# Patient Record
Sex: Female | Born: 1985 | Race: Black or African American | Hispanic: No | Marital: Single | Smoking: Current every day smoker
Health system: Southern US, Community
[De-identification: ages and names within clinical notes are randomized; demographics above are authoritative.]

## PROBLEM LIST (undated history)

## (undated) DIAGNOSIS — J45909 Unspecified asthma, uncomplicated: Secondary | ICD-10-CM

## (undated) HISTORY — PX: TUBAL LIGATION: SHX77

## (undated) HISTORY — PX: TONSILLECTOMY: SUR1361

---
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
## 2005-10-19 ENCOUNTER — Emergency Department (HOSPITAL_COMMUNITY): Admission: EM | Admit: 2005-10-19 | Discharge: 2005-10-19 | Payer: Self-pay | Admitting: Emergency Medicine

## 2006-04-26 ENCOUNTER — Inpatient Hospital Stay (HOSPITAL_COMMUNITY): Admission: RE | Admit: 2006-04-26 | Discharge: 2006-04-27 | Payer: Self-pay | Admitting: Obstetrics and Gynecology

## 2006-05-01 ENCOUNTER — Ambulatory Visit (HOSPITAL_COMMUNITY): Admission: RE | Admit: 2006-05-01 | Discharge: 2006-05-01 | Payer: Self-pay | Admitting: Obstetrics and Gynecology

## 2006-06-11 ENCOUNTER — Inpatient Hospital Stay (HOSPITAL_COMMUNITY): Admission: RE | Admit: 2006-06-11 | Discharge: 2006-06-13 | Payer: Self-pay | Admitting: Obstetrics and Gynecology

## 2006-06-11 ENCOUNTER — Encounter (INDEPENDENT_AMBULATORY_CARE_PROVIDER_SITE_OTHER): Payer: Self-pay | Admitting: *Deleted

## 2006-06-24 ENCOUNTER — Emergency Department (HOSPITAL_COMMUNITY): Admission: EM | Admit: 2006-06-24 | Discharge: 2006-06-24 | Payer: Self-pay | Admitting: Emergency Medicine

## 2008-05-22 ENCOUNTER — Emergency Department (HOSPITAL_COMMUNITY): Admission: EM | Admit: 2008-05-22 | Discharge: 2008-05-22 | Payer: Self-pay | Admitting: Emergency Medicine

## 2008-05-28 ENCOUNTER — Emergency Department (HOSPITAL_COMMUNITY): Admission: EM | Admit: 2008-05-28 | Discharge: 2008-05-28 | Payer: Self-pay | Admitting: Emergency Medicine

## 2008-06-02 ENCOUNTER — Encounter: Admission: RE | Admit: 2008-06-02 | Discharge: 2008-06-02 | Payer: Self-pay | Admitting: Family Medicine

## 2008-08-23 ENCOUNTER — Emergency Department (HOSPITAL_COMMUNITY): Admission: EM | Admit: 2008-08-23 | Discharge: 2008-08-23 | Payer: Self-pay | Admitting: Emergency Medicine

## 2010-06-26 ENCOUNTER — Emergency Department (HOSPITAL_COMMUNITY): Admission: EM | Admit: 2010-06-26 | Discharge: 2010-06-26 | Payer: Self-pay | Admitting: Emergency Medicine

## 2010-06-30 ENCOUNTER — Emergency Department (HOSPITAL_COMMUNITY): Admission: EM | Admit: 2010-06-30 | Discharge: 2010-06-30 | Payer: Self-pay | Admitting: Emergency Medicine

## 2010-10-12 ENCOUNTER — Ambulatory Visit (HOSPITAL_COMMUNITY)
Admission: RE | Admit: 2010-10-12 | Discharge: 2010-10-12 | Payer: Self-pay | Source: Home / Self Care | Attending: Obstetrics & Gynecology | Admitting: Obstetrics & Gynecology

## 2011-01-16 LAB — URINALYSIS, ROUTINE W REFLEX MICROSCOPIC
Glucose, UA: NEGATIVE mg/dL
Ketones, ur: NEGATIVE mg/dL
Nitrite: NEGATIVE
pH: 5.5 (ref 5.0–8.0)

## 2011-01-16 LAB — CBC
Hemoglobin: 14.1 g/dL (ref 12.0–15.0)
MCH: 30.5 pg (ref 26.0–34.0)
MCHC: 35 g/dL (ref 30.0–36.0)
Platelets: 268 10*3/uL (ref 150–400)
RDW: 13.4 % (ref 11.5–15.5)

## 2011-01-16 LAB — COMPREHENSIVE METABOLIC PANEL
ALT: 16 U/L (ref 0–35)
AST: 20 U/L (ref 0–37)
Albumin: 4.3 g/dL (ref 3.5–5.2)
Calcium: 9.7 mg/dL (ref 8.4–10.5)
Creatinine, Ser: 0.83 mg/dL (ref 0.4–1.2)
GFR calc Af Amer: >60 mL/min (ref >60)
Sodium: 141 mEq/L (ref 135–145)
Total Protein: 7.1 g/dL (ref 6.0–8.3)

## 2011-01-16 LAB — RAPID URINE DRUG SCREEN, HOSP PERFORMED
Barbiturates: NOT DETECTED
Benzodiazepines: POSITIVE — AB
Cocaine: NOT DETECTED

## 2011-01-16 LAB — SURGICAL PCR SCREEN: MRSA, PCR: NEGATIVE

## 2011-01-19 LAB — PREGNANCY, URINE: Preg Test, Ur: NEGATIVE

## 2011-01-19 LAB — URINALYSIS, ROUTINE W REFLEX MICROSCOPIC
Glucose, UA: NEGATIVE mg/dL
Ketones, ur: 15 mg/dL — AB
Nitrite: NEGATIVE
Specific Gravity, Urine: 1.03 (ref 1.005–1.030)
pH: 5.5 (ref 5.0–8.0)

## 2011-01-19 LAB — CBC
HCT: 43.8 % (ref 36.0–46.0)
MCHC: 33.5 g/dL (ref 30.0–36.0)
MCV: 91.5 fL (ref 78.0–100.0)
RDW: 13.6 % (ref 11.5–15.5)

## 2011-01-19 LAB — DIFFERENTIAL
Lymphs Abs: 0.4 10*3/uL — ABNORMAL LOW (ref 0.7–4.0)
Monocytes Relative: 5 % (ref 3–12)
Neutro Abs: 3.1 10*3/uL (ref 1.7–7.7)
Neutrophils Relative %: 84 % — ABNORMAL HIGH (ref 43–77)

## 2011-01-19 LAB — COMPREHENSIVE METABOLIC PANEL
BUN: 9 mg/dL (ref 6–23)
Calcium: 9 mg/dL (ref 8.4–10.5)
Glucose, Bld: 99 mg/dL (ref 70–99)
Sodium: 135 mEq/L (ref 135–145)
Total Protein: 7 g/dL (ref 6.0–8.3)

## 2011-02-10 ENCOUNTER — Emergency Department (HOSPITAL_COMMUNITY)
Admission: EM | Admit: 2011-02-10 | Discharge: 2011-02-10 | Disposition: A | Discharge status: Home / Self Care | Payer: Self-pay | Attending: Emergency Medicine | Admitting: Emergency Medicine

## 2011-02-10 DIAGNOSIS — R1903 Right lower quadrant abdominal swelling, mass and lump: Secondary | ICD-10-CM | POA: Insufficient documentation

## 2011-02-10 DIAGNOSIS — R109 Unspecified abdominal pain: Secondary | ICD-10-CM | POA: Insufficient documentation

## 2011-03-24 NOTE — Discharge Summary (Signed)
Jill Kramer, Jill Kramer             ACCOUNT NO.:  [id number]   MEDICAL RECORD NO.:  [id number]          PATIENT TYPE:  INP   LOCATION:  A412                          FACILITY:  [facility]   PHYSICIAN:  Richardean Canal, M.D.  DATE OF BIRTH:  Jun 06, 1986   DATE OF ADMISSION:  06/11/2006  DATE OF DISCHARGE:  LH                                 DISCHARGE SUMMARY   HISTORY:  This 25yo single black female gravida 2, now para 2, cesarean  section 1 (now 2) with estimated date of confinement June 26, 2006 was  admitted at [redacted] weeks gestation with a history of prior cesarean section.  The patient was admitted in labor and it was decided to proceed with a  cesarean section.  The details of the patient's history and physical  examination are documented in the typed admission note.   OPERATIVE PROCEDURES:  June 11, 2006 repeat low transverse cesarean  section.  The patient was delivered of a 5 pound 12 ounce female infant,  Apgar 9/9.  The estimated blood loss for this procedure was less than 500  mL.   HOSPITAL COURSE:  The patient's postoperative course was afebrile and  uneventful.  The patient had normal resumption of bowel and bladder function  and showed evidence of good wound healing.  The patient was discharged to  home on the morning of her second postoperative day in satisfactory  condition.   FINAL DIAGNOSIS:  Intrauterine gestation at 38 weeks, history of prior  cesarean section.   DISPOSITION:  Discharged home.  Office follow-up for skin staple removal in  one week.  Motrin 600 #15 take one four times daily for pain prevention.  Vicodin 5/500 #15 take one every 4 to 6 hours as needed for pain  breakthrough.                                            ______________________________  Richardean Canal, M.D.     RW/MEDQ  D:  06/13/2006  T:  06/13/2006  Job:  [id number]

## 2011-03-24 NOTE — Group Therapy Note (Signed)
NAMEEMMALYNNE, COURTNEY             ACCOUNT NO.:  [id number]   MEDICAL RECORD NO.:  [id number]          PATIENT TYPE:  OIB   LOCATION:  LDR3                          FACILITY:  [facility]   PHYSICIAN:  Tilda Burrow, M.D. DATE OF BIRTH:  [birth year]   DATE OF PROCEDURE:  DATE OF DISCHARGE:                                   PROGRESS NOTE   PROGRESS NOTE:  Jill Kramer is now [redacted] weeks pregnant. She was last week in  labor and delivery for complaints lower abdominal cramping and contractions.  She was having a few contractions when she came in last time and responded  to subcu terbutaline and subsequent Po dose.  She received a course of  betamethasone.  We held her for over 24 hours observation because the baby  was so low, however there was no cervical change. This morning she is not  having any contractions; when she complains of a cramp, hjer abdomen  remained soft.  Her cervix is unchanged, very posterior and the vertex is  low at 0 to -1 station.  So right now we are evaluating urinalysis to see if  we can find a source of her lower abdominal pain, although I do wonder if it  is still not the pressure due to the low position of the baby.  We will  continue to monitor her contractions. The fetal heart rate looks within  normal limits.      Jacklyn Shell, C.N.M.      Tilda Burrow, M.D.  Electronically Signed    FC/MEDQ  D:  05/01/2006  T:  05/01/2006  Job:  [id number]   cc:   Tilda Burrow, M.D.  Fax: [phone]   [facility]

## 2011-03-24 NOTE — Group Therapy Note (Signed)
Jill Kramer, Jill Kramer             ACCOUNT NO.:  [id number]   MEDICAL RECORD NO.:  [id number]          PATIENT TYPE:  INP   LOCATION:  LDR2                          FACILITY:  [facility]   PHYSICIAN:  Tilda Burrow, M.D. DATE OF BIRTH:  Jan 12, 1986   DATE OF PROCEDURE:  04/27/2006  DATE OF DISCHARGE:                                   PROGRESS NOTE   HISTORY:  Jill Kramer was admitted in the early morning hours of April 26, 2006  by [facility], with pre-term labor.  She received four subcutaneous doses of  Terbutaline for contractions and was started on a p.o. protocol.  She got  relief from her contractions, but she did have what she considered an 8/10  amount of pain in the form of pressure.   PHYSICAL EXAMINATION:  The vertex is very low at 0 station; however,  her  cervix has remained closed and posterior.  We kept her on monitoring  throughout the day and the night, just to make sure that there was no break-  through uterine activity. She received two doses of betamethasone 24 hours  apart.  She is not having any complaints of pressure now.  No contractions  and the fetal heart rate is reactive.  Additionally her OB ultrasound was  normal.   We will send her home on p.o. Terbutaline, to follow up in the office next  week.     ______________________________  Jacklyn Shell, CNM      Tilda Burrow, M.D.  Electronically Signed    FC/MEDQ  D:  04/27/2006  T:  04/27/2006  Job:  [id number]

## 2011-03-24 NOTE — H&P (Signed)
Jill Kramer, Jill Kramer             ACCOUNT NO.:  [id number]   MEDICAL RECORD NO.:  [id number]          PATIENT TYPE:  OIB   LOCATION:  LDR1                          FACILITY:  [facility]   PHYSICIAN:  Lazaro Arms, M.D.   DATE OF BIRTH:  [birth year]   DATE OF ADMISSION:  12/19/2004  DATE OF DISCHARGE:  LH                                HISTORY & PHYSICAL   HISTORY OF PRESENT ILLNESS:  Jill Kramer is an 25 year old, gravida 1, para 0,  with a twin pregnancy at 26 weeks and 3 days, who started having cramping at  7 p.m. tonight, who presents at 9:30 complaining of cramping.  Upon being  placed on the monitor, it was noted she was having contractions.  I was  notified at approximately 10 o'clock.  Also, the patient states at  approximately 10 p.m., she started feeling wet.  At that time, the nurse  noticed a puddle, and nitrazine was positive at that time.   MEDICAL HISTORY:  Negative.   SURGICAL HISTORY:  Positive for tonsils and adenoids.   ALLERGIES:  No known allergies.   FAMILY HISTORY:  Negative.   SOCIAL HISTORY:  Negative.   PRENATAL COURSE:  Positive for a twin pregnancy with separate sacs.  Blood  type is B positive.  She was positive for THC and UDS.  Hepatitis B surface  antigen negative.  HIV is negative.  Serology is non-reactive.  Pap is class  I.  GC and Chlamydia were negative.  Glucola within normal limits.  Sickle  cell screen was negative.   PHYSICAL EXAMINATION:  VITAL SIGNS:  Temperature 98.2, pulse 91,  respirations 20, blood pressure 123/65.  Fetal heart rate showed some mild  variables.  PELVIC:  A sterile speculum exam was performed.  The patient is leaking a  copious amount of clear fluid with bits of vernix in the clear fluid.  Could  not visualize the cervix.  A vaginal exam was not performed at this time due  to prematurity.   Dr. Despina Hidden was notified.  The patient was given 0.25 of Brethine subcutaneous.  Dr. Despina Hidden is on his way in to arrange for the  patient's transfer to a  tertiary facility for preterm newborn care.     DL/MEDQ  D:  56/21/3086  T:  12/19/2004  Job:  [id number]

## 2011-03-24 NOTE — Op Note (Signed)
Jill Kramer, Jill Kramer             ACCOUNT NO.:  [id number]   MEDICAL RECORD NO.:  [id number]          PATIENT TYPE:  INP   LOCATION:  A412                          FACILITY:  [facility]   PHYSICIAN:  Richardean Canal, M.D.  DATE OF BIRTH:  [birth year]   DATE OF PROCEDURE:  06/11/2006  DATE OF DISCHARGE:                                 OPERATIVE REPORT   PREOPERATIVE DIAGNOSIS:  Intrauterine gestation, 38 weeks.  History of prior  cesarean section.   POSTOPERATIVE DIAGNOSIS:  Intrauterine gestation, 38 weeks.  History of  prior cesarean section.   PROCEDURE:  Repeat low transverse cesarean section.   SURGEON:  Richardean Canal, M.D.   ANESTHESIA:  Spinal.   DESCRIPTION OF OPERATIVE PROCEDURE:  Following induction of spinal  anesthesia, the patient was placed in supine position with a roll under the  right hip.  The abdomen was suitably prepped and draped for surgery.  The  abdomen was opened through a Pfannenstiel incision in the line of the old  scar.  The uterovesical fold of peritoneum was incised transversely and the  bladder flap developed with sharp dissection.  A transverse incision was  made in the lower uterine segment and extended with two index fingers.  The  fetus was presenting vertex and in a transverse position and deeply engaged.  The head was disengaged from the pelvis and slowly delivered through the  incision.  The airway was suctioned prior to the first breath.  The patient  was delivered of a normal-appearing 5-pound 12-ounce female infant, Apgar  9/9.  The cord was clamped and divided and the newborn transferred to Birder Robson, D.O. attending the newborn.  Pitocin 20 units was added to the  intravenous infusion and run rapidly.  Ancef 1 gram was administered  intravenously.  The uterus was firmed up with manual massage and the  placenta delivered and inspected.  The placenta appeared intact and normal  and was discarded.  The uterus was exteriorized.  The uterine  incision was  closed with a running interlocking 0 chromic suture.  A second layer of 0  chromic suture was placed in a running, non-interlocking vertical mattress  imbricating stitch.  The line of incision appeared dry.  The tubes and  ovaries appeared normal.  The uterus was replaced.  The gutters were  cleansed.  The peritoneum was closed with a running 2-0 chromic suture.  The  rectus muscles were pulled together in the midline with two interrupted 0  Vicryl sutures.  The fascia was closed with two segmentally run 0 Vicryl  sutures tied together in the midline.  The subcutaneous layer had an ooze  with no identifiable bleeders.  The subcutaneous layer was irrigated with  normal saline and continued to exhibit an ooze.  For this reason, this layer  was closed with a running 2-0 Vicryl suture.  The  skin was closed with skin staples.  The estimated blood loss was less than  500 mL.  A pressure dressing was placed.  All sponge, instrument, and needle  counts were reported correct.  The patient tolerated the procedure well  and  left the operating room in satisfactory postoperative condition.           ______________________________  Richardean Canal, M.D.     RW/MEDQ  D:  06/11/2006  T:  06/11/2006  Job:  [id number]

## 2011-03-24 NOTE — H&P (Signed)
Jill Kramer, Jill Kramer             ACCOUNT NO.:  [id number]   MEDICAL RECORD NO.:  [id number]          PATIENT TYPE:  OIB   LOCATION:  LDR2                          FACILITY:  [facility]   PHYSICIAN:  Tilda Burrow, M.D. DATE OF BIRTH:  [birth year]   DATE OF ADMISSION:  04/26/2006  DATE OF DISCHARGE:  LH                                HISTORY & PHYSICAL   HISTORY OF PRESENT ILLNESS:  This 25 year old female, gravida 2, para 0-1-0-  1, with a prior preterm delivery at 26 weeks by cesarean section for twins,  is admitted at this time for management of preterm uterine irritability and  pelvic pressure.   Jill Kramer presented shortly after 1 a.m. this morning due to discomfort which  had lasted several hours, was managed on subcu terbutaline protocol with 2  doses of subcu terbutaline, betamethasone therapy and observation.  She has  continued to complain of pelvic discomfort and at 2 p.m. today is noted to  have recurrence of uterine contractions of a mild intensity on a regular  pattern.  She is therefore admitted for continued overnight observation and  resumption of subcu terbutaline therapy.  She was only 4 hours after her  last oral dose of terbutaline when she began to contract more significantly.   PAST MEDICAL HISTORY:  Benign.   SURGICAL HISTORY:  Cesarean section in February 2006, [facility], for  premature twins.   ALLERGIES:  None.   SOCIAL HISTORY:  Lives with the baby's father.  Not working.   FAMILY HISTORY:  Positive for hypertension, diabetes.   She used to smoke.  She denies alcohol or recreational drugs.   PHYSICAL EXAMINATION:  GENERAL:  A healthy-appearing, somewhat anxious  African-American female who has multiple complaints.  She is very aware of  any discomforts or changes in fetal movement or position, which makes  assessing the severity of contractions somewhat difficult.  She is alert and  oriented x3.  ABDOMEN:  A preterm gravid uterus, with no  tenderness except mild discomfort  with contractions.  Ultrasound has been performed that shows estimated fetal  weight of 3 pounds 13 ounces, vertex presentation is confirmed.  Cervix is  posterior long and closed with presenting part low in the pelvis.   PLAN:  Admit, preterm management with the addition of IV antibiotics,  continuation of terbutaline.  Patient started on subcu terbutaline at this  point in time.  Good prognosis for delay of labor, delay of delivery.  If  labor symptoms become more significant, may require more magnesium sulfate  therapy.      Tilda Burrow, M.D.  Electronically Signed     JVF/MEDQ  D:  04/26/2006  T:  04/26/2006  Job:  [id number]

## 2011-03-24 NOTE — H&P (Signed)
Jill Kramer, Jill Kramer             ACCOUNT NO.:  [id number]   MEDICAL RECORD NO.:  [id number]          PATIENT TYPE:  INP   LOCATION:  A412                          FACILITY:  [facility]   PHYSICIAN:  Richardean Canal, M.D.  DATE OF BIRTH:  02-26-1986   DATE OF ADMISSION:  06/11/2006  DATE OF DISCHARGE:  LH                                HISTORY & PHYSICAL   HISTORY:  This is a 25 year old single black female, gravida 2, para 1, C-  section 1, with [facility] June 26, 2006, admitted at 18 weeks' gestation with a  history of prior cesarean section.  The patient has had previous observation  admissions to [facility] and has been managed on terbutaline.  The patient  broke through on her oral terbutaline and came to Labor and Delivery, where  she was noted to be having painful contractions every 3-5 minutes.  Membranes are intact.  Nifedipine 20 mg was prescribed orally, but the  patient vomited.  It was decided to proceed with repeat cesarean section at  38 weeks' gestation.   PAST MEDICAL HISTORY:   MEDICAL:  Negative.   SURGICAL:  Cesarean section in 2006 at 26 weeks for twins; 1 twin survived  and 1 died at 10 days.   DRUG ALLERGIES:  None known.   REGULAR MEDICATIONS:  Prenatal vitamins and iron.   PHYSICAL EXAMINATION:  GENERAL:  The patient appears as a well-developed,  well-nourished, term-pregnant black female in no acute distress, having 3-  to 5-minute painful contractions.  HEART:  Normal sinus rhythm.  No murmurs are heard.  LUNGS:  Clear.  ABDOMEN:  The fetus is presenting vertex.  The fundus is term size.  There  is a Pfannenstiel scar on the abdomen.  The uterus is soft and nontender  between contractions.  EXTREMITIES:  There is no peripheral edema.  Reflexes are normal.   IMPRESSION:  1.  Uterine gestation at 38 weeks.  2.  History of prior cesarean section.  3.  Early labor.   DISPOSITION:  The patient will undergo repeat cesarean section and has been  carefully  counseled with her mother for this procedure.  Possible  complications of surgery including, but not limited to bleeding,  transfusion, infection, injury to an internal organ and wound complications  have been discussed with the patient and her mother.  The patient has  verbalized understanding and consent.                                            ______________________________  Richardean Canal, M.D.     RW/MEDQ  D:  06/11/2006  T:  06/11/2006  Job:  [id number]

## 2011-07-13 ENCOUNTER — Emergency Department (HOSPITAL_COMMUNITY)
Admission: EM | Admit: 2011-07-13 | Discharge: 2011-07-13 | Disposition: A | Discharge status: Home / Self Care | Payer: Self-pay | Attending: Emergency Medicine | Admitting: Emergency Medicine

## 2011-07-13 DIAGNOSIS — F17200 Nicotine dependence, unspecified, uncomplicated: Secondary | ICD-10-CM | POA: Insufficient documentation

## 2011-07-13 DIAGNOSIS — K7689 Other specified diseases of liver: Secondary | ICD-10-CM | POA: Insufficient documentation

## 2011-07-13 DIAGNOSIS — R109 Unspecified abdominal pain: Secondary | ICD-10-CM | POA: Insufficient documentation

## 2011-07-13 DIAGNOSIS — Z9851 Tubal ligation status: Secondary | ICD-10-CM | POA: Insufficient documentation

## 2011-07-13 LAB — DIFFERENTIAL
Basophils Relative: 0 % (ref 0–1)
Monocytes Relative: 10 % (ref 3–12)
Neutro Abs: 2.1 10*3/uL (ref 1.7–7.7)
Neutrophils Relative %: 47 % (ref 43–77)

## 2011-07-13 LAB — CBC
Hemoglobin: 13.5 g/dL (ref 12.0–15.0)
MCHC: 33.9 g/dL (ref 30.0–36.0)
Platelets: 272 10*3/uL (ref 150–400)
RBC: 4.43 MIL/uL (ref 3.87–5.11)

## 2011-07-13 LAB — BASIC METABOLIC PANEL
BUN: 15 mg/dL (ref 6–23)
Chloride: 102 mEq/L (ref 96–112)
GFR calc Af Amer: >60 mL/min (ref >60)
Potassium: 3.9 mEq/L (ref 3.5–5.1)
Sodium: 139 mEq/L (ref 135–145)

## 2011-07-13 LAB — URINE MICROSCOPIC-ADD ON

## 2011-07-13 LAB — URINALYSIS, ROUTINE W REFLEX MICROSCOPIC
Ketones, ur: NEGATIVE mg/dL
Leukocytes, UA: NEGATIVE
Nitrite: NEGATIVE
Specific Gravity, Urine: 1.01 (ref 1.005–1.030)
pH: 7.5 (ref 5.0–8.0)

## 2011-07-13 LAB — PREGNANCY, URINE: Preg Test, Ur: NEGATIVE

## 2011-07-13 MED ORDER — OXYCODONE-ACETAMINOPHEN 5-325 MG PO TABS: 1.0000 | ORAL_TABLET | ORAL | Status: AC | PRN

## 2011-07-13 MED ORDER — FENTANYL CITRATE 0.05 MG/ML IJ SOLN
50.0000 ug | INTRAMUSCULAR | Status: AC
  Administered 2011-07-13: 50 ug via INTRAVENOUS
  Filled 2011-07-13: qty 2

## 2011-07-13 NOTE — ED Notes (Signed)
Pt states she has had approx. 2 year period of abd pain which begins acutely and then subsides over time. Pain onset this am has not resolved.

## 2011-07-13 NOTE — ED Provider Notes (Addendum)
History     CSN: [id number] Arrival date & time: 07/13/2011 10:01 AM  Chief Complaint  Patient presents with  . Abdominal Pain   Patient is a 25 y.o. female presenting with abdominal pain. The history is provided by the patient.  Abdominal Pain The primary symptoms of the illness include abdominal pain. The primary symptoms of the illness do not include nausea, vomiting or diarrhea. The onset of the illness was sudden. The problem has been resolved.  The patient states that she believes she is currently not pregnant. The patient has not had a change in bowel habit. Symptoms associated with the illness do not include diaphoresis or constipation.  Episode began suddenly this morning, and lasted about 1.5 hours. She has had similar episodes in the past two years, but they usually only last a few seconds. Pain is severe - rated 10/10, but is at 0/10 currently. Pain is described as crampy. Nothing makes it better, nothing makes it worse. She has been seen in the ED for this and was told there was something on her liver, but her PCP told her it was nothing.  History reviewed. No pertinent past medical history.  Past Surgical History  Procedure Date  . Tubal ligation     No family history on file.  History  Substance Use Topics  . Smoking status: Current Everyday Smoker  . Smokeless tobacco: Not on file  . Alcohol Use: No    OB History    Grav Para Term Preterm Abortions TAB SAB Ect Mult Living                  Review of Systems  Constitutional: Negative for diaphoresis.  Gastrointestinal: Positive for abdominal pain. Negative for nausea, vomiting, diarrhea and constipation.  All other systems reviewed and are negative.    Physical Exam  BP 115/56  Pulse 70  Temp(Src) 98.3 F (36.8 C) (Oral)  Resp 22  Wt 154 lb (69.854 kg)  SpO2 100%  LMP 07/09/2011  Physical Exam  Constitutional: She is oriented to person, place, and time. She appears well-developed and well-nourished.  No distress.  HENT:  Head: Normocephalic and atraumatic.  Right Ear: External ear normal.  Left Ear: External ear normal.  Mouth/Throat: Oropharynx is clear and moist.  Eyes: Conjunctivae and EOM are normal. Pupils are equal, round, and reactive to light. No scleral icterus.  Neck: Normal range of motion. Neck supple. No JVD present.  Cardiovascular: Normal rate, regular rhythm and normal heart sounds.   No murmur heard. Pulmonary/Chest: Effort normal and breath sounds normal. She has no wheezes. She has no rales.  Abdominal: Soft. Bowel sounds are normal. She exhibits no mass. There is tenderness. There is no rebound and no guarding.       Mild lower abdominal and RUQ tenderness. No CVA tenderness.  Musculoskeletal: Normal range of motion. She exhibits no edema and no tenderness.  Lymphadenopathy:    She has no cervical adenopathy.  Neurological: She is alert and oriented to person, place, and time. She has normal reflexes. No cranial nerve deficit.  Skin: Skin is warm and dry. No rash noted.  Psychiatric: She has a normal mood and affect. Her behavior is normal. Thought content normal.    ED Course  Procedures Results for orders placed during the hospital encounter of 07/13/11  PREGNANCY, URINE      Component Value Range   Preg Test, Ur NEGATIVE    URINALYSIS, ROUTINE W REFLEX MICROSCOPIC  Component Value Range   Color, Urine YELLOW  YELLOW    Appearance CLEAR  CLEAR    Specific Gravity, Urine 1.010  1.005 - 1.030    pH 7.5  5.0 - 8.0    Glucose, UA NEGATIVE  NEGATIVE (mg/dL)   Hgb urine dipstick NEGATIVE  NEGATIVE    Bilirubin Urine NEGATIVE  NEGATIVE    Ketones, ur NEGATIVE  NEGATIVE (mg/dL)   Protein, ur TRACE (*) NEGATIVE (mg/dL)   Urobilinogen, UA 1.0  0.0 - 1.0 (mg/dL)   Nitrite NEGATIVE  NEGATIVE    Leukocytes, UA NEGATIVE  NEGATIVE   CBC      Component Value Range   WBC 4.4  4.0 - 10.5 (K/uL)   RBC 4.43  3.87 - 5.11 (MIL/uL)   Hemoglobin 13.5  12.0 - 15.0  (g/dL)   HCT 16.1  09.6 - 04.5 (%)   MCV 89.8  78.0 - 100.0 (fL)   MCH 30.5  26.0 - 34.0 (pg)   MCHC 33.9  30.0 - 36.0 (g/dL)   RDW 40.9  81.1 - 91.4 (%)   Platelets 272  150 - 400 (K/uL)  DIFFERENTIAL      Component Value Range   Neutrophils Relative 47  43 - 77 (%)   Neutro Abs 2.1  1.7 - 7.7 (K/uL)   Lymphocytes Relative 40  12 - 46 (%)   Lymphs Abs 1.8  0.7 - 4.0 (K/uL)   Monocytes Relative 10  3 - 12 (%)   Monocytes Absolute 0.4  0.1 - 1.0 (K/uL)   Eosinophils Relative 3  0 - 5 (%)   Eosinophils Absolute 0.1  0.0 - 0.7 (K/uL)   Basophils Relative 0  0 - 1 (%)   Basophils Absolute 0.0  0.0 - 0.1 (K/uL)  BASIC METABOLIC PANEL      Component Value Range   Sodium 139  135 - 145 (mEq/L)   Potassium 3.9  3.5 - 5.1 (mEq/L)   Chloride 102  96 - 112 (mEq/L)   CO2 25  19 - 32 (mEq/L)   Glucose, Bld 87  70 - 99 (mg/dL)   BUN 15  6 - 23 (mg/dL)   Creatinine, Ser 7.82  0.50 - 1.10 (mg/dL)   Calcium 9.3  8.4 - 95.6 (mg/dL)   GFR calc non Af Amer >60  >60 (mL/min)   GFR calc Af Amer >60  >60 (mL/min)  URINE MICROSCOPIC-ADD ON      Component Value Range   RBC / HPF 0-2  <3 (RBC/hpf)   No results found.   MDM Prior ED visits, labs, x-rays reviewed. CT scan one year ago showed a small hepatic lesion which was felt to be benign, but radiologist had recommended follow-up MRI to further evaluate.  Episodic pain is most likely functional in origin given normal labs and unremarkable CT and benign course over two years.  Discussed labs and prior evaluation with patient and her family. She had MRI to follow up on liver lesion. No indication for imaging today, will refer to GI for outpatient consult. Suspect IBS.  Dione Booze, MD 07/13/11 1324  Dione Booze, MD 07/13/11 [id number]

## 2011-07-13 NOTE — ED Notes (Signed)
Pt complain of sudden onset of generalized abd pain

## 2011-08-04 LAB — DIFFERENTIAL
Lymphocytes Relative: 37
Lymphs Abs: 1.7
Monocytes Absolute: 0.3
Monocytes Relative: 6
Neutro Abs: 2.4
Neutrophils Relative %: 54

## 2011-08-04 LAB — CBC
Hemoglobin: 13.1
MCHC: 33.3
RBC: 4.41
WBC: 4.4

## 2011-08-10 ENCOUNTER — Emergency Department (HOSPITAL_COMMUNITY)
Admission: EM | Admit: 2011-08-10 | Discharge: 2011-08-10 | Discharge status: Against Medical Advice | Payer: Self-pay | Attending: Emergency Medicine | Admitting: Emergency Medicine

## 2011-08-10 DIAGNOSIS — T63461A Toxic effect of venom of wasps, accidental (unintentional), initial encounter: Secondary | ICD-10-CM | POA: Insufficient documentation

## 2011-08-10 DIAGNOSIS — T6391XA Toxic effect of contact with unspecified venomous animal, accidental (unintentional), initial encounter: Secondary | ICD-10-CM | POA: Insufficient documentation

## 2011-08-10 DIAGNOSIS — Z5320 Procedure and treatment not carried out because of patient's decision for unspecified reasons: Secondary | ICD-10-CM | POA: Insufficient documentation

## 2011-08-12 DIAGNOSIS — IMO0001 Reserved for inherently not codable concepts without codable children: Secondary | ICD-10-CM | POA: Insufficient documentation

## 2011-08-12 DIAGNOSIS — L03019 Cellulitis of unspecified finger: Secondary | ICD-10-CM | POA: Insufficient documentation

## 2011-08-12 DIAGNOSIS — L02519 Cutaneous abscess of unspecified hand: Secondary | ICD-10-CM | POA: Insufficient documentation

## 2011-08-12 DIAGNOSIS — F17200 Nicotine dependence, unspecified, uncomplicated: Secondary | ICD-10-CM | POA: Insufficient documentation

## 2011-08-12 DIAGNOSIS — M79609 Pain in unspecified limb: Secondary | ICD-10-CM | POA: Insufficient documentation

## 2011-08-12 DIAGNOSIS — M2540 Effusion, unspecified joint: Secondary | ICD-10-CM | POA: Insufficient documentation

## 2011-08-13 ENCOUNTER — Encounter (HOSPITAL_COMMUNITY): Payer: Self-pay

## 2011-08-13 ENCOUNTER — Emergency Department (HOSPITAL_COMMUNITY)
Admission: EM | Admit: 2011-08-13 | Discharge: 2011-08-13 | Disposition: A | Discharge status: Home / Self Care | Payer: Self-pay | Attending: Emergency Medicine | Admitting: Emergency Medicine

## 2011-08-13 DIAGNOSIS — L0390 Cellulitis, unspecified: Secondary | ICD-10-CM

## 2011-08-13 MED ORDER — DOXYCYCLINE HYCLATE 100 MG PO TABS
100.0000 mg | ORAL_TABLET | Freq: Once | ORAL | Status: AC
  Administered 2011-08-13: 100 mg via ORAL
  Filled 2011-08-13: qty 1

## 2011-08-13 MED ORDER — DOXYCYCLINE HYCLATE 100 MG PO CAPS: 100.0000 mg | ORAL_CAPSULE | Freq: Two times a day (BID) | ORAL | Status: AC

## 2011-08-13 MED ORDER — HYDROCODONE-ACETAMINOPHEN 5-325 MG PO TABS: ORAL_TABLET | ORAL | Status: AC

## 2011-08-13 MED ORDER — HYDROCODONE-ACETAMINOPHEN 5-325 MG PO TABS
2.0000 | ORAL_TABLET | Freq: Once | ORAL | Status: AC
  Administered 2011-08-13: 2 via ORAL
  Filled 2011-08-13: qty 2

## 2011-08-13 NOTE — ED Provider Notes (Signed)
History     CSN: [id number] Arrival date & time: 08/13/2011 12:10 AM  No chief complaint on file.   (Consider location/radiation/quality/duration/timing/severity/associated sxs/prior treatment) HPI Comments: Patient states she burned her finger 2 days ago while using a hot glue gun and had a blister on the right fifth finger.  States the blister "popped" during the night and now has pain, redness and swelling of her finger.  She denies other injuries  Patient is a 25 y.o. female presenting with hand pain. The history is provided by the patient.  Hand Pain This is a new problem. The current episode started in the past 7 days. The problem occurs constantly. The problem has been unchanged. Associated symptoms include joint swelling and myalgias. Pertinent negatives include no arthralgias, chills, fatigue, fever, headaches, neck pain, numbness, rash, sore throat or weakness. Exacerbated by: movement , palpation. She has tried nothing for the symptoms. The treatment provided no relief.    History reviewed. No pertinent past medical history.  Past Surgical History  Procedure Date  . Tubal ligation     History reviewed. No pertinent family history.  History  Substance Use Topics  . Smoking status: Current Everyday Smoker -- 0.3 packs/day  . Smokeless tobacco: Not on file  . Alcohol Use: No    OB History    Grav Para Term Preterm Abortions TAB SAB Ect Mult Living                  Review of Systems  Constitutional: Negative for fever, chills and fatigue.  HENT: Negative for sore throat, trouble swallowing, neck pain and neck stiffness.   Genitourinary: Negative for dysuria, hematuria and flank pain.  Musculoskeletal: Positive for myalgias and joint swelling. Negative for back pain and arthralgias.  Skin: Positive for color change and wound. Negative for rash.  Neurological: Negative for dizziness, weakness, numbness and headaches.  Hematological: Does not bruise/bleed easily.    All other systems reviewed and are negative.    Allergies  Review of patient's allergies indicates no known allergies.  Home Medications  No current outpatient prescriptions on file.  BP 140/77  Pulse 65  Temp(Src) 98.5 F (36.9 C) (Oral)  Resp 20  Ht 5\' 2"  (1.575 m)  Wt 155 lb (70.308 kg)  BMI 28.35 kg/m2  SpO2 100%  LMP 08/09/2011  Physical Exam  Nursing note and vitals reviewed. Constitutional: She is oriented to person, place, and time. She appears well-developed and well-nourished. No distress.  HENT:  Head: Normocephalic and atraumatic.  Cardiovascular: Normal rate, regular rhythm and normal heart sounds.   Pulmonary/Chest: Effort normal and breath sounds normal. No respiratory distress. She exhibits no tenderness.  Abdominal: Soft.  Musculoskeletal: Normal range of motion. She exhibits edema and tenderness.       Previous wound to the fat pad of the right fifth finger.  Mild STS and erythema distally.    Lymphadenopathy:    She has no cervical adenopathy.  Neurological: She is alert and oriented to person, place, and time. No cranial nerve deficit. She exhibits normal muscle tone. Coordination normal.  Skin: Skin is warm and dry. There is erythema.    ED Course  Procedures (including critical care time)       MDM        12:32 AM mild STS to the distal right fifth finger.  Previously open blister to the fat pad area of the finger with mild surrounding erythema.  Likely cellulitis.  No felon, paronychia or fingernail injury.  ROM is intact.  Patient agrees to return here in 2-3 days for recheck and possible I&D if the sx's are not improving  Azzan Butler L. [place], [place] 08/13/11 [phone]

## 2011-08-13 NOTE — ED Notes (Signed)
Got hot glue on right hand 5th finger at work 2 days ago, cont. To have pain and large blister that burst

## 2011-08-13 NOTE — ED Provider Notes (Signed)
Medical screening examination/treatment/procedure(s) were performed by non-physician practitioner and as supervising physician I was immediately available for consultation/collaboration.  Nicoletta Dress. Colon Branch, MD 08/13/11 2107

## 2012-03-03 ENCOUNTER — Encounter (HOSPITAL_COMMUNITY): Payer: Self-pay | Admitting: *Deleted

## 2012-03-03 ENCOUNTER — Emergency Department (HOSPITAL_COMMUNITY): Payer: Medicaid Other

## 2012-03-03 ENCOUNTER — Emergency Department (HOSPITAL_COMMUNITY)
Admission: EM | Admit: 2012-03-03 | Discharge: 2012-03-03 | Disposition: A | Discharge status: Home / Self Care | Payer: Medicaid Other | Attending: Emergency Medicine | Admitting: Emergency Medicine

## 2012-03-03 DIAGNOSIS — R1903 Right lower quadrant abdominal swelling, mass and lump: Secondary | ICD-10-CM | POA: Insufficient documentation

## 2012-03-03 DIAGNOSIS — R109 Unspecified abdominal pain: Secondary | ICD-10-CM | POA: Insufficient documentation

## 2012-03-03 DIAGNOSIS — M545 Low back pain, unspecified: Secondary | ICD-10-CM | POA: Insufficient documentation

## 2012-03-03 DIAGNOSIS — M4310 Spondylolisthesis, site unspecified: Secondary | ICD-10-CM | POA: Insufficient documentation

## 2012-03-03 DIAGNOSIS — M4316 Spondylolisthesis, lumbar region: Secondary | ICD-10-CM

## 2012-03-03 DIAGNOSIS — R222 Localized swelling, mass and lump, trunk: Secondary | ICD-10-CM

## 2012-03-03 LAB — URINALYSIS, ROUTINE W REFLEX MICROSCOPIC
Bilirubin Urine: NEGATIVE
Nitrite: NEGATIVE
Specific Gravity, Urine: 1.025 (ref 1.005–1.030)
Urobilinogen, UA: 0.2 mg/dL (ref 0.0–1.0)
pH: 6 (ref 5.0–8.0)

## 2012-03-03 LAB — BASIC METABOLIC PANEL
BUN: 13 mg/dL (ref 6–23)
CO2: 27 mEq/L (ref 19–32)
Chloride: 103 mEq/L (ref 96–112)
Glucose, Bld: 90 mg/dL (ref 70–99)
Potassium: 3.8 mEq/L (ref 3.5–5.1)
Sodium: 137 mEq/L (ref 135–145)

## 2012-03-03 LAB — CBC
Hemoglobin: 14.1 g/dL (ref 12.0–15.0)
MCH: 30.5 pg (ref 26.0–34.0)
MCV: 90.7 fL (ref 78.0–100.0)
Platelets: 273 10*3/uL (ref 150–400)
RBC: 4.63 MIL/uL (ref 3.87–5.11)
WBC: 4.2 10*3/uL (ref 4.0–10.5)

## 2012-03-03 LAB — URINE MICROSCOPIC-ADD ON

## 2012-03-03 LAB — PREGNANCY, URINE: Preg Test, Ur: NEGATIVE

## 2012-03-03 MED ORDER — HYDROCODONE-ACETAMINOPHEN 7.5-325 MG PO TABS: 1.0000 | ORAL_TABLET | ORAL | Status: AC | PRN

## 2012-03-03 MED ORDER — IOHEXOL 300 MG/ML  SOLN: 100.0000 mL | Freq: Once | INTRAMUSCULAR | Status: DC | PRN

## 2012-03-03 NOTE — ED Notes (Signed)
Pt DC to home with steady gait.  Pt waiting for ride in [facility]

## 2012-03-03 NOTE — Discharge Instructions (Signed)
Your lab test are negative for acute problem. Your CT scan question a change in a mass of the right lobe of the liver. There is a mass of the lower abdominal muscle, and there is some displacement of the L5 vertebra on the S1 vertebra. Please call Dr Parke Simmers tomorrow for office appointment to address these problems. Use tylenol or motrin for mild pain. Use Norco for more severe pain. This medication may cause drowsiness an/or constipation, use with caution.Spondylolysis with Rehab Spondylolysis is a condition of the spine that is characterized by stress fracture of one or more vertebrae. These stress fractures occur in an area of the vertebrae between the facet joints (pons inter-articularis). Facet joints are what enable the spine to move and pivot.  SYMPTOMS   Dull, achy pain in the lower back. Pain that worsens with extension of the spine.   Tightness of the muscles on the back of the thigh.   Lower back stiffness.  CAUSES  Stress fractures occur when repetitive stress damages a bone faster than the bone can heal. Common mechanisms of injury for spondylolysis include:  Repetitive extension beyond normal limits of the spine (hyperextension).   Repetitive excessive rotation of the spine.   Direct trauma (uncommon).  RISK INCREASES WITH:  Activities that have a risk of hyper-extending the back.   Activities that have a risk of excessively rotating the spine.   Poor strength and flexibility   Failure to warm-up properly before activity.   Family history of spondylolysis or slippage or movement of one vertebra on another (spondylolisthesis).   Improper sports technique.  PREVENTION  Use proper technique.   Wear proper protective equipment and ensure correct fit.   Appropriately warm up and stretch before practice or competition.   Maintain appropriate conditioning:   Back and hamstring flexibility.   Back muscle strength and endurance.   Cardiovascular fitness.  PROGNOSIS    If treated properly, then spondylolysis usually heals within 6 weeks of non-surgical (conservative) treatment. RELATED COMPLICATIONS   Recurrent symptoms that result in a chronic problem.   Inability to compete in athletics.   Prolonged healing time, if improperly treated or re-injured.   Failure of the fracture to heal (nonunion)   Healing of the fracture in a poor position (malunion).   Progression to spondylolisthesis.  TREATMENT  Treatment initially involves resting from any activities that aggravate the symptoms, and the use of ice and medications to help reduce pain and inflammation. The use of strengthening and stretching exercises may help reduce pain with activity. These exercises may be performed at home or with referral to a therapist. It is important to learn how to use proper body mechanics so undue stress is not placed on your spine. If the injury is severe, then your caregiver may recommend a back brace to allow for healing or even surgery. Surgery often involves fusing two adjacent vertebrae, so no movement is allowed between them. Surgery is rarely performed for this condition; however, if symptoms persist for greater than 12 months despite non-surgical (conservative) treatment, then surgery may be recommended. MEDICATION   If pain medication is necessary, then nonsteroidal anti-inflammatory medications, such as aspirin and ibuprofen, or other minor pain relievers, such as acetaminophen, are often recommended.   Do not take pain medication for 7 days before surgery.   Prescription pain relievers may be given if deemed necessary by your caregiver. Use only as directed and only as much as you need.  HEAT AND COLD:  Cold treatment (icing)  relieves pain and reduces inflammation. Cold treatment should be applied for 10 to 15 minutes every 2 to 3 hours for inflammation and pain and immediately after any activity that aggravates your symptoms. Use ice packs or massage the area  with a piece of ice (ice massage).   Heat treatment may be used prior to performing the stretching and strengthening activities prescribed by your caregiver, physical therapist, or athletic trainer. Use a heat pack or soak your injury in warm water.  SEEK MEDICAL CARE IF:  Treatment seems to offer no benefit, or the condition worsens.   Any medications produce adverse side effects.  EXERCISES RANGE OF MOTION (ROM) AND STRETCHING EXERCISES - Spondylolysis Most people with low back pain will find that their symptoms worsen with either excessive bending forward (flexion) or arching at the low back (extension). The exercises which will help resolve your symptoms will focus on the opposite motion. Your physician, physical therapist or athletic trainer will help you determine which exercises will be most helpful to resolve your low back pain. Do not complete any exercises without first consulting with your clinician. Discontinue any exercises which worsen your symptoms until you speak to your clinician. You may have pain, numbness or tingling which travels down into your buttocks, leg or foot.  The goal of the therapy is to get these symptoms to recede to your back and eventually resolve. Occasionally, these leg symptoms will get better, but your low back pain may worsen; this is typically an indication of progress in your rehabilitation. Be certain to be very alert to any changes in your symptoms and the activities in which you participated in the 24 hours prior to the change. Sharing this information with your clinician will allow him/her to most efficiently treat your condition. These exercises may help you when beginning to rehabilitate your injury. Your symptoms may resolve with or without further involvement from your physician, physical therapist or athletic trainer. While completing these exercises, remember:   Restoring tissue flexibility helps normal motion to return to the joints. This allows  healthier, less painful movement and activity.   An effective stretch should be held for at least 30 seconds.   A stretch should never be painful. You should only feel a gentle lengthening or release in the stretched tissue.  FLEXION RANGE OF MOTION AND STRETCHING EXERCISES: STRETCH - Flexion, Single Knee to Chest  Lie on a firm bed or the floor with both legs extended in front of you.   Keeping one leg in contact with the floor, bring your opposite knee to your chest. Hold your leg in place by either grabbing behind your thigh or at your knee.   Pull until you feel a gentle stretch in your low back. Hold __________ seconds.   Slowly release your grasp and repeat the exercise with the opposite side.  Repeat __________ times. Complete this exercise __________ times per day.  STRETCH - Flexion, Double Knee to Chest  Lie on a firm bed or the floor with both legs extended in front of you.   Keeping one leg in contact with the floor, bring your opposite knee to your chest.   Tense your stomach muscles to support your back and then lift your other knee to your chest. Hold your legs in place by either grabbing behind your thighs or at your knees.   Pull both knees toward your chest until you feel a gentle stretch in your low back. Hold __________ seconds.   Tense your  stomach muscles and slowly return one leg at a time to the floor.  Repeat __________ times. Complete this exercise __________ times per day.  STRENGTHENING EXERCISES - Spondylolysis These exercises may help you when beginning to rehabilitate your injury. These exercises should be done near your "sweet spot." This is the neutral, low-back arch, somewhere between fully rounded and fully arched, that is your least painful position. When performed in this safe range of motion, these exercises can be used for people who have either a flexion or extension based injury. These exercises may resolve your symptoms with or without further  involvement from your physician, physical therapist or athletic trainer. While completing these exercises, remember:   Muscles can gain both the endurance and the strength needed for everyday activities through controlled exercises.   Complete these exercises as instructed by your physician, physical therapist or athletic trainer. Progress with the resistance and repetition exercises only as your caregiver advises.   You may experience muscle soreness or fatigue, but the pain or discomfort you are trying to eliminate should never worsen during these exercises. If this pain does worsen, stop and make certain you are following the directions exactly. If the pain is still present after adjustments, discontinue the exercise until you can discuss the trouble with your clinician.  STRENGTHENING - Deep Abdominals, Pelvic Tilt   Lie on a firm bed or the floor. Keeping your legs in front of you, bend your knees so they are both pointed toward the ceiling and your feet are flat on the floor.   Tense your lower abdominal muscles to press your low back into the floor. This motion will rotate your pelvis so that your tail bone is scooping upwards rather than pointing at your feet or into the floor.   With a gentle tension and even breathing, hold this position for __________ seconds.  Repeat __________ times. Complete this exercise __________ times per day.  STRENGTHENING - Abdominals, Crunches  Lie on a firm bed or the floor. Keeping your legs in front of you, bend your knees so they are both pointed toward the ceiling and your feet are flat on the floor. Cross your arms over your chest.   Slightly tip your chin down without bending your neck.   Tense your abdominals and slowly lift your trunk high enough to just clear your shoulder blades. Lifting higher can put excessive stress on the low back and does not further strengthen your abdominal muscles.   Control your return to the starting position.    Repeat __________ times. Complete this exercise __________ times per day.  STRENGTHENING - Quadruped, Opposite UE/LE Lift  Assume a hands and knees position on a firm surface. Keep your hands under your shoulders and your knees under your hips. You may place padding under your knees for comfort.   Find your neutral spine and gently tense your abdominal muscles so that you can maintain this position. Your shoulders and hips should form a rectangle that is parallel with the floor and is not twisted.   Keeping your trunk steady, lift your right hand no higher than your shoulder and then your left leg no higher than your hip. Make sure you are not holding your breath. Hold this position __________ seconds.   Continuing to keep your abdominal muscles tense and your back steady, slowly return to your starting position. Repeat with the opposite arm and leg.  Repeat __________ times. Complete this exercise __________ times per day.  STRENGTHENING - Lower  Abdominals, Double Knee Lift  Lie on a firm bed or the floor. Keeping your legs in front of you, bend your knees so they are both pointed toward the ceiling and your feet are flat on the floor.   Tense your abdominal muscles to brace your low back and slowly lift both of your knees until they come over your hips. Be certain not to hold your breath.   Hold __________ seconds. Using your abdominal muscles, return to the starting position in a slow and controlled manner.  Repeat __________ times. Complete this exercise __________ times per day.  POSTURE AND BODY MECHANICS CONSIDERATIONS - Spondylolysis Keeping correct posture when sitting, standing or completing your activities will reduce the stress put on different body tissues, allowing injured tissues a chance to heal and limiting painful experiences. The following are general guidelines for improved posture. Your physician or physical therapist will provide you with any instructions specific to your  needs. While reading these guidelines, remember:  The exercises prescribed by your provider will help you have the flexibility and strength to maintain correct postures.   The correct posture provides the optimal environment for your joints to work. All of your joints have less wear and tear when properly supported by a spine with good posture. This means you will experience a healthier, less painful body.   Correct posture must be practiced with all of your activities, especially prolonged sitting and standing. Correct posture is as important when doing repetitive low-stress activities (typing) as it is when doing a single heavy-load activity (lifting).  PROPER SITTING POSTURE In order to minimize stress and discomfort on your spine, you must sit with correct posture. Sitting with good posture should be effortless for a healthy body. Returning to good posture is a gradual process. Many people can work toward this most comfortably by using various supports until they have the flexibility and strength to maintain this posture on their own. When sitting with proper posture, your ears will fall over your shoulders and your shoulders will fall over your hips. You should use the back of the chair to support your upper back. Your low back will be in a neutral position, just slightly arched. You may place a small pillow or folded towel at the base of your low back for support.  When working at a desk, create an environment that supports good, upright posture. Without extra support, muscles fatigue and lead to excessive strain on joints and other tissues. Keep these recommendations in mind: CHAIR:  A chair should be able to slide under your desk when your back makes contact with the back of the chair. This allows you to work closely.   The chair's height should allow your eyes to be level with the upper part of your monitor and your hands to be slightly lower than your elbows.  BODY POSITION  Your feet  should make contact with the floor. If this is not possible, use a foot rest.   Keep your ears over your shoulders. This will reduce stress on your neck and low back.  INCORRECTSITTING POSTURES If you are feeling tired and unable to assume a healthy sitting posture, do not slouch or slump. This puts excessive strain on your back tissues, causing more damage and pain. Healthier options include:  Using more support, like a lumbar pillow.   Switching tasks to something that requires you to be upright or walking.   Talking a brief walk.   Lying down to rest in a  neutral-spine position.  CORRECT LIFTING TECHNIQUES DO :   Assume a wide stance. This will provide you more stability and the opportunity to get as close as possible to the object which you are lifting.   Tense your abdominals to brace your spine; then bend at the knees and hips. Keeping your back locked in a neutral-spine position, lift using your leg muscles. Lift with your legs, keeping your back straight.   Test the weight of unknown objects before attempting to lift them.   Try to keep your elbows locked down at your sides in order get the best strength from your shoulders when carrying an object.   Always ask for help when lifting heavy or awkward objects.  INCORRECT LIFTING TECHNIQUES DO NOT:   Lock your knees when lifting, even if it is a small object.   Bend and twist. Pivot at your feet or move your feet when needing to change directions.   Assume that you cannot safely pick up a paperclip without proper posture.  Document Released: 10/23/2005 Document Revised: 10/12/2011 Document Reviewed: 02/04/2009 [facility] Patient Information 2012 [place], [place].

## 2012-03-03 NOTE — ED Notes (Signed)
Pain to right lower abdomen, hardened area present. Pt states pain is worse when she has her period which she is currently on. Area noticed after having tubal ligation in December. Pt states she wasn't tests done. States "they only felt of it last time".

## 2012-03-04 LAB — URINE CULTURE: Culture  Setup Time: 201304282118

## 2012-03-06 NOTE — ED Provider Notes (Signed)
History     CSN: [id number]  Arrival date & time 03/03/12  1034   First MD Initiated Contact with Patient 03/03/12 1052      Chief Complaint  Patient presents with  . Abdominal Pain    (Consider location/radiation/quality/duration/timing/severity/associated sxs/prior treatment) Patient is a 26 y.o. female presenting with abdominal pain. The history is provided by the patient.  Abdominal Pain The primary symptoms of the illness include abdominal pain. The primary symptoms of the illness do not include shortness of breath or dysuria. Primary symptoms comment: abd mass The current episode started more than 2 days ago. The onset of the illness was gradual. The problem has been gradually worsening.  Associated with: unknown. The patient states that she believes she is currently not pregnant. The patient has not had a change in bowel habit. Symptoms associated with the illness do not include anorexia, diaphoresis, heartburn, constipation, hematuria, frequency or back pain. Significant associated medical issues do not include PUD, diabetes, sickle cell disease, diverticulitis, HIV or cardiac disease.    History reviewed. No pertinent past medical history.  Past Surgical History  Procedure Date  . Tubal ligation   . Tonsillectomy   . Cesarean section     No family history on file.  History  Substance Use Topics  . Smoking status: Current Everyday Smoker -- 0.3 packs/day  . Smokeless tobacco: Not on file  . Alcohol Use: Yes     Occ    OB History    Grav Para Term Preterm Abortions TAB SAB Ect Mult Living                  Review of Systems  Constitutional: Negative for diaphoresis and activity change.       All ROS Neg except as noted in HPI  HENT: Negative for nosebleeds and neck pain.   Eyes: Negative for photophobia and discharge.  Respiratory: Negative for cough, shortness of breath and wheezing.   Cardiovascular: Negative for chest pain and palpitations.    Gastrointestinal: Positive for abdominal pain. Negative for heartburn, constipation, blood in stool and anorexia.  Genitourinary: Negative for dysuria, frequency and hematuria.  Musculoskeletal: Negative for back pain and arthralgias.  Skin: Negative.   Neurological: Negative for dizziness, seizures and speech difficulty.  Psychiatric/Behavioral: Negative for hallucinations and confusion.    Allergies  Review of patient's allergies indicates no known allergies.  Home Medications   Current Outpatient Rx  Name Route Sig Dispense Refill  . IBUPROFEN 200 MG PO TABS Oral Take 800 mg by mouth every 6 (six) hours as needed. Pain    . HYDROCODONE-ACETAMINOPHEN 7.5-325 MG PO TABS Oral Take 1 tablet by mouth every 4 (four) hours as needed for pain. 20 tablet 0    BP 134/74  Pulse 74  Temp(Src) 98.4 F (36.9 C) (Oral)  Resp 16  Ht 5\' 2"  (1.575 m)  Wt 155 lb (70.308 kg)  BMI 28.35 kg/m2  SpO2 100%  LMP 02/29/2012  Physical Exam  Abdominal:       Small to mod firm area at the right lowr abd, near a tubal ligation scar. Not pulsatile. Mod tenderness.  Musculoskeletal:       Lumbar area pain to palpation and ROM    ED Course  Procedures (including critical care time)  Labs Reviewed  URINALYSIS, ROUTINE W REFLEX MICROSCOPIC - Abnormal; Notable for the following:    Hgb urine dipstick TRACE (*)    All other components within normal limits  URINE MICROSCOPIC-ADD  ON - Abnormal; Notable for the following:    Squamous Epithelial / LPF FEW (*)    Bacteria, UA FEW (*)    All other components within normal limits  BASIC METABOLIC PANEL  CBC  PREGNANCY, URINE  URINE CULTURE  LAB REPORT - SCANNED   No results found.   1. Abdominal wall mass   2. Spondylolisthesis of lumbar region       MDM  I have reviewed nursing notes, vital signs, and all appropriate lab and imaging results for this patient. Ct suggest an abd wall mass. There is spondylolistesis of the lumbar spine. Pt to  see the surgeon for evaluation an biopsy.       Kathie Dike, [place] 03/06/12 2147

## 2012-03-07 NOTE — ED Provider Notes (Signed)
Medical screening examination/treatment/procedure(s) were performed by non-physician practitioner and as supervising physician I was immediately available for consultation/collaboration.   Carleene Cooper III, MD 03/07/12 [phone]

## 2012-03-11 ENCOUNTER — Other Ambulatory Visit (HOSPITAL_COMMUNITY): Payer: Self-pay | Admitting: Family Medicine

## 2012-03-13 ENCOUNTER — Ambulatory Visit (HOSPITAL_COMMUNITY)
Admission: RE | Admit: 2012-03-13 | Discharge: 2012-03-13 | Disposition: A | Discharge status: Home / Self Care | Payer: Medicaid Other | Source: Ambulatory Visit | Attending: Family Medicine | Admitting: Family Medicine

## 2012-03-13 DIAGNOSIS — K7689 Other specified diseases of liver: Secondary | ICD-10-CM | POA: Insufficient documentation

## 2012-03-13 MED ORDER — GADOXETATE DISODIUM 0.25 MMOL/ML IV SOLN
7.5000 mL | Freq: Once | INTRAVENOUS | Status: AC | PRN
  Administered 2012-03-13: 7.5 mL via INTRAVENOUS

## 2012-03-20 ENCOUNTER — Telehealth: Payer: Self-pay | Admitting: Urgent Care

## 2012-03-20 ENCOUNTER — Ambulatory Visit: Payer: Medicaid Other | Admitting: Urgent Care

## 2012-03-20 NOTE — Telephone Encounter (Signed)
PCP is aware  

## 2012-03-20 NOTE — Telephone Encounter (Signed)
Please let referring physician know

## 2012-03-20 NOTE — Telephone Encounter (Signed)
NO SHOW

## 2012-03-21 ENCOUNTER — Encounter: Payer: Self-pay | Admitting: Urgent Care

## 2012-03-21 NOTE — Telephone Encounter (Signed)
PCP is aware and DO NOT neglect your health letter was mailed.

## 2012-11-05 ENCOUNTER — Emergency Department (HOSPITAL_COMMUNITY): Payer: Medicaid Other

## 2012-11-05 ENCOUNTER — Emergency Department (HOSPITAL_COMMUNITY)
Admission: EM | Admit: 2012-11-05 | Discharge: 2012-11-05 | Disposition: A | Discharge status: Home / Self Care | Payer: Medicaid Other | Attending: Emergency Medicine | Admitting: Emergency Medicine

## 2012-11-05 ENCOUNTER — Encounter (HOSPITAL_COMMUNITY): Payer: Self-pay | Admitting: *Deleted

## 2012-11-05 DIAGNOSIS — F17200 Nicotine dependence, unspecified, uncomplicated: Secondary | ICD-10-CM | POA: Insufficient documentation

## 2012-11-05 DIAGNOSIS — M79673 Pain in unspecified foot: Secondary | ICD-10-CM

## 2012-11-05 DIAGNOSIS — Z23 Encounter for immunization: Secondary | ICD-10-CM | POA: Insufficient documentation

## 2012-11-05 DIAGNOSIS — M79609 Pain in unspecified limb: Secondary | ICD-10-CM | POA: Insufficient documentation

## 2012-11-05 MED ORDER — HYDROCODONE-ACETAMINOPHEN 5-325 MG PO TABS
1.0000 | ORAL_TABLET | Freq: Once | ORAL | Status: AC
  Administered 2012-11-05: 1 via ORAL
  Filled 2012-11-05: qty 1

## 2012-11-05 MED ORDER — HYDROCODONE-ACETAMINOPHEN 5-325 MG PO TABS: 1.0000 | ORAL_TABLET | Freq: Four times a day (QID) | ORAL | Status: AC | PRN

## 2012-11-05 MED ORDER — TETANUS-DIPHTH-ACELL PERTUSSIS 5-2.5-18.5 LF-MCG/0.5 IM SUSP
0.5000 mL | Freq: Once | INTRAMUSCULAR | Status: AC
  Administered 2012-11-05: 0.5 mL via INTRAMUSCULAR
  Filled 2012-11-05: qty 0.5

## 2012-11-05 NOTE — ED Notes (Signed)
Pt states she has been having racing thoughts for the past few days. Reports her brother was killed and she has been having a hard time dealing with his loss. Denies any suicidal thoughts and states she loves herself too much to do anything to harm herself. Admits that she needs to get back on her medication for bipolar and states she will follow up with her PCP to get some medications.

## 2012-11-05 NOTE — ED Provider Notes (Signed)
History     CSN: [id number]  Arrival date & time 11/05/12  4098   First MD Initiated Contact with Patient 11/05/12 [phone]      Chief Complaint  Patient presents with  . Foot Pain    (Consider location/radiation/quality/duration/timing/severity/associated sxs/prior treatment) HPI  History reviewed. No pertinent past medical history.  Past Surgical History  Procedure Date  . Tubal ligation   . Tonsillectomy   . Cesarean section     No family history on file.  History  Substance Use Topics  . Smoking status: Current Every Day Smoker -- 0.3 packs/day    Types: Cigarettes  . Smokeless tobacco: Not on file  . Alcohol Use: Yes     Comment: Occ    OB History    Grav Para Term Preterm Abortions TAB SAB Ect Mult Living                  Review of Systems  Allergies  Review of patient's allergies indicates no known allergies.  Home Medications   Current Outpatient Rx  Name  Route  Sig  Dispense  Refill  . HYDROCODONE-ACETAMINOPHEN 5-325 MG PO TABS   Oral   Take 1 tablet by mouth every 6 (six) hours as needed for pain.   20 tablet   0     BP 121/71  Pulse 73  Temp 98.1 F (36.7 C) (Oral)  Resp 20  Ht 5\' 4"  (1.626 m)  Wt 160 lb (72.576 kg)  BMI 27.46 kg/m2  SpO2 100%  LMP 10/28/2012  Physical Exam  ED Course  Procedures (including critical care time)  Labs Reviewed - No data to display Dg Foot Complete Left  11/05/2012  *RADIOLOGY REPORT*  Clinical Data: Stepped on a stable.  LEFT FOOT - COMPLETE 3+ VIEW  Comparison: None.  Findings: The joint spaces are maintained.  No acute fracture or radiopaque foreign body.  IMPRESSION: No acute bony findings or radiopaque foreign body.   Original Report Authenticated By: Rudie Meyer, M.D.      1. Foot pain       MDM  No FB visible on XR rx-hydrocodone, 20 Ibuprofen F/u with PCP prn        Evalina Field, PA 11/05/12 1201

## 2012-11-05 NOTE — ED Notes (Signed)
Stepped on furniture staple x 2 wks ago.  C/o pain to left heel x 3 days.  Unknown last tetnus injection.

## 2012-11-05 NOTE — ED Provider Notes (Signed)
Medical screening examination/treatment/procedure(s) were performed by non-physician practitioner and as supervising physician I was immediately available for consultation/collaboration.   Flint Melter, MD 11/05/12 [phone]

## 2012-11-10 ENCOUNTER — Emergency Department (HOSPITAL_COMMUNITY)
Admission: EM | Admit: 2012-11-10 | Discharge: 2012-11-10 | Disposition: A | Discharge status: Home / Self Care | Payer: Medicaid Other | Attending: Emergency Medicine | Admitting: Emergency Medicine

## 2012-11-10 ENCOUNTER — Encounter (HOSPITAL_COMMUNITY): Payer: Self-pay | Admitting: *Deleted

## 2012-11-10 DIAGNOSIS — F17200 Nicotine dependence, unspecified, uncomplicated: Secondary | ICD-10-CM | POA: Insufficient documentation

## 2012-11-10 DIAGNOSIS — Z79899 Other long term (current) drug therapy: Secondary | ICD-10-CM | POA: Insufficient documentation

## 2012-11-10 DIAGNOSIS — L02619 Cutaneous abscess of unspecified foot: Secondary | ICD-10-CM | POA: Insufficient documentation

## 2012-11-10 DIAGNOSIS — L02612 Cutaneous abscess of left foot: Secondary | ICD-10-CM

## 2012-11-10 DIAGNOSIS — L03119 Cellulitis of unspecified part of limb: Secondary | ICD-10-CM | POA: Insufficient documentation

## 2012-11-10 MED ORDER — TRAMADOL HCL 50 MG PO TABS
50.0000 mg | ORAL_TABLET | Freq: Once | ORAL | Status: AC
  Administered 2012-11-10: 20:00:00 via ORAL

## 2012-11-10 MED ORDER — TRAMADOL HCL 50 MG PO TABS
ORAL_TABLET | ORAL | Status: AC
  Filled 2012-11-10: qty 1

## 2012-11-10 MED ORDER — LIDOCAINE HCL (PF) 2 % IJ SOLN
INTRAMUSCULAR | Status: AC
  Administered 2012-11-10: 19:00:00
  Filled 2012-11-10: qty 10

## 2012-11-10 MED ORDER — TRAMADOL HCL 50 MG PO TABS: 50.0000 mg | ORAL_TABLET | Freq: Four times a day (QID) | ORAL | Status: DC | PRN

## 2012-11-10 MED ORDER — SULFAMETHOXAZOLE-TRIMETHOPRIM 800-160 MG PO TABS: 1.0000 | ORAL_TABLET | Freq: Two times a day (BID) | ORAL | Status: DC

## 2012-11-10 NOTE — ED Notes (Signed)
Stepped on nail x 2 wks ago - seen for same x 6 days ago.  Reports is not any better.  Pain to left heel.

## 2012-11-10 NOTE — ED Notes (Signed)
Patient with no complaints at this time. Respirations even and unlabored. Skin warm/dry. Discharge instructions reviewed with patient at this time. Patient given opportunity to voice concerns/ask questions. Patient discharged at this time and left Emergency Department with steady gait.   

## 2012-11-10 NOTE — ED Provider Notes (Signed)
Pt stepped on a staple about 3 weeks ago and now has painful swelling in her left heel and is unable to bear weight on it, states she feels her heart throbbing in her heel.   Bedside ultrasound done. Patient noted to have an abscess cavity near the area where she had an obvious old puncture wound. It was viewed in 2 planes. The views were archived. Burgess Amor, PA was in the room when ultrasound was done and we discussed her approach to drain the abscess.   Medical screening examination/treatment/procedure(s) were conducted as a shared visit with non-physician practitioner(s) and myself.  I personally evaluated the patient during the encounter  Devoria Albe, MD, Franz Dell, MD 11/10/12 [phone]

## 2012-11-10 NOTE — ED Provider Notes (Signed)
History     CSN: [id number]  Arrival date & time 11/10/12  1750   First MD Initiated Contact with Patient 11/10/12 1801      Chief Complaint  Patient presents with  . Foot Pain    (Consider location/radiation/quality/duration/timing/severity/associated sxs/prior treatment) HPI Comments: Jill Kramer is a 27 y.o. Female presents with increased pain and swelling in her left heel since stepping on a staple about 3 weeks ago.  She was seen here on 12/31 for this same complaint at which time her xrays were negative for foreign body.  She has had increased pain,  Swelling with radiation into her ankle since then.  She denies fevers, chills and has taken hydrocodone which has not relieved her pain.     The history is provided by the patient.    History reviewed. No pertinent past medical history.  Past Surgical History  Procedure Date  . Tubal ligation   . Tonsillectomy   . Cesarean section     No family history on file.  History  Substance Use Topics  . Smoking status: Current Every Day Smoker -- 0.3 packs/day    Types: Cigarettes  . Smokeless tobacco: Not on file  . Alcohol Use: Yes     Comment: Occ    OB History    Grav Para Term Preterm Abortions TAB SAB Ect Mult Living                  Review of Systems  Skin: Positive for wound.  Neurological: Negative for weakness and numbness.    Allergies  Review of patient's allergies indicates no known allergies.  Home Medications   Current Outpatient Rx  Name  Route  Sig  Dispense  Refill  . HYDROCODONE-ACETAMINOPHEN 5-325 MG PO TABS   Oral   Take 1 tablet by mouth every 6 (six) hours as needed for pain.   20 tablet   0   . SULFAMETHOXAZOLE-TRIMETHOPRIM 800-160 MG PO TABS   Oral   Take 1 tablet by mouth 2 (two) times daily.   28 tablet   0   . TRAMADOL HCL 50 MG PO TABS   Oral   Take 1 tablet (50 mg total) by mouth every 6 (six) hours as needed for pain.   15 tablet   0     BP 139/70  Pulse 87   Temp 98.1 F (36.7 C) (Oral)  Resp 24  Ht 5\' 3"  (1.6 m)  Wt 160 lb (72.576 kg)  BMI 28.34 kg/m2  SpO2 100%  LMP 10/28/2012  Physical Exam  Constitutional: She is oriented to person, place, and time. She appears well-developed and well-nourished.  HENT:  Head: Normocephalic.  Cardiovascular: Normal rate.   Pulmonary/Chest: Effort normal.  Musculoskeletal: She exhibits edema and tenderness.       Left foot: She exhibits tenderness and swelling.       Left heel with tenderness, induration,  Central darkened macule surrounded by induration.  No red streaking.    Neurological: She is alert and oriented to person, place, and time. No sensory deficit.  Skin: Laceration noted.    ED Course  Procedures (including critical care time)   Bedside US performed with evidence for abscess pocket.  INCISION AND DRAINAGE Performed by: Burgess Amor Consent: Verbal consent obtained. Risks and benefits: risks, benefits and alternatives were discussed Type: abscess  Body area: left heel  Anesthesia: local infiltration  Incision was made with a scalpel.  Local anesthetic: lidocaine 2% without epinephrine  Anesthetic total: 2 ml  Complexity: complex Blunt dissection to break up loculations  Drainage: purulent  Drainage amount: small amount  Packing material: 1/4 in iodoform gauze  Patient tolerance: Patient tolerated the procedure well with no immediate complications.     Labs Reviewed - No data to display No results found.   1. Abscess of left foot       MDM  Bactrim,  Tramadol,  Start warm soaks with epsom salts,  Crutches,  Dressing supplied.  Pt encouraged to f/u with pcp or return here for any worsened sx.        Burgess Amor, [place] 11/10/12 [phone]

## 2012-11-10 NOTE — ED Provider Notes (Signed)
See prior note   Ward Givens, MD 11/10/12 [phone]

## 2013-05-21 ENCOUNTER — Encounter (HOSPITAL_COMMUNITY): Payer: Self-pay

## 2013-05-21 ENCOUNTER — Emergency Department (HOSPITAL_COMMUNITY): Payer: Medicaid Other

## 2013-05-21 ENCOUNTER — Emergency Department (HOSPITAL_COMMUNITY)
Admission: EM | Admit: 2013-05-21 | Discharge: 2013-05-21 | Disposition: A | Discharge status: Home / Self Care | Payer: Medicaid Other | Attending: Emergency Medicine | Admitting: Emergency Medicine

## 2013-05-21 DIAGNOSIS — S9030XA Contusion of unspecified foot, initial encounter: Secondary | ICD-10-CM | POA: Insufficient documentation

## 2013-05-21 DIAGNOSIS — Y9302 Activity, running: Secondary | ICD-10-CM | POA: Insufficient documentation

## 2013-05-21 DIAGNOSIS — R269 Unspecified abnormalities of gait and mobility: Secondary | ICD-10-CM | POA: Insufficient documentation

## 2013-05-21 DIAGNOSIS — S9031XA Contusion of right foot, initial encounter: Secondary | ICD-10-CM

## 2013-05-21 DIAGNOSIS — F17200 Nicotine dependence, unspecified, uncomplicated: Secondary | ICD-10-CM | POA: Insufficient documentation

## 2013-05-21 DIAGNOSIS — Y92009 Unspecified place in unspecified non-institutional (private) residence as the place of occurrence of the external cause: Secondary | ICD-10-CM | POA: Insufficient documentation

## 2013-05-21 DIAGNOSIS — W010XXA Fall on same level from slipping, tripping and stumbling without subsequent striking against object, initial encounter: Secondary | ICD-10-CM | POA: Insufficient documentation

## 2013-05-21 MED ORDER — HYDROCODONE-ACETAMINOPHEN 5-325 MG PO TABS: ORAL_TABLET | ORAL | Status: DC

## 2013-05-21 MED ORDER — NAPROXEN 500 MG PO TABS: 500.0000 mg | ORAL_TABLET | Freq: Two times a day (BID) | ORAL | Status: DC

## 2013-05-21 NOTE — ED Notes (Signed)
Pt reports was running through her house yesterday and tripped over a cord and fell.  C/O pain in r great toe and top of foot.

## 2013-05-21 NOTE — ED Provider Notes (Signed)
History    CSN: [id number] Arrival date & time 05/21/13  0830  First MD Initiated Contact with Patient 05/21/13 [phone]     Chief Complaint  Patient presents with  . Foot Pain   (Consider location/radiation/quality/duration/timing/severity/associated sxs/prior Treatment) HPI Comments: Jill Kramer is a 27 y.o. female who presents to the Emergency Department complaining of right foot pain.  States pain began after she tripped and fell. She states that her" toes and backwards" pain is worse with weightbearing. She states she has taken Tylenol without relief she has not applied ice. She denies any pain proximal to the foot.  Patient is a 27 y.o. female presenting with lower extremity pain. The history is provided by the patient.  Foot Pain This is a new problem. The current episode started yesterday. The problem occurs constantly. The problem has been unchanged. Associated symptoms include arthralgias. Pertinent negatives include no chest pain, chills, fever, headaches, joint swelling, myalgias, neck pain, numbness, rash, sore throat, vomiting or weakness. The symptoms are aggravated by walking, twisting and bending. She has tried acetaminophen for the symptoms. The treatment provided no relief.   History reviewed. No pertinent past medical history. Past Surgical History  Procedure Laterality Date  . Tubal ligation    . Tonsillectomy    . Cesarean section     No family history on file. History  Substance Use Topics  . Smoking status: Current Every Day Smoker -- 0.30 packs/day    Types: Cigarettes  . Smokeless tobacco: Not on file  . Alcohol Use: Yes     Comment: Occ   OB History   Grav Para Term Preterm Abortions TAB SAB Ect Mult Living                 Review of Systems  Constitutional: Negative for fever and chills.  HENT: Negative for sore throat and neck pain.   Cardiovascular: Negative for chest pain.  Gastrointestinal: Negative for vomiting.  Genitourinary: Negative  for dysuria and difficulty urinating.  Musculoskeletal: Positive for arthralgias and gait problem. Negative for myalgias and joint swelling.  Skin: Negative for color change, rash and wound.  Neurological: Negative for weakness, numbness and headaches.  All other systems reviewed and are negative.    Allergies  Review of patient's allergies indicates no known allergies.  Home Medications   Current Outpatient Rx  Name  Route  Sig  Dispense  Refill  . acetaminophen (TYLENOL) 325 MG tablet   Oral   Take 650 mg by mouth every 6 (six) hours as needed for pain.          BP 111/78  Pulse 65  Temp(Src) 98.7 F (37.1 C) (Oral)  Resp 18  Ht 5\' 3"  (1.6 m)  Wt 185 lb (83.915 kg)  BMI 32.78 kg/m2  SpO2 95%  LMP 05/15/2013 Physical Exam  Nursing note and vitals reviewed. Constitutional: She is oriented to person, place, and time. She appears well-developed and well-nourished. No distress.  HENT:  Head: Normocephalic and atraumatic.  Cardiovascular: Normal rate, regular rhythm, normal heart sounds and intact distal pulses.   Pulmonary/Chest: Effort normal and breath sounds normal. No respiratory distress.  Musculoskeletal: She exhibits tenderness. She exhibits no edema.  Tenderness to palpation of the dorsal right foot at the proximal right great toe and second toe. DP pulse is brisk, distal sensation intact.  No erythema, abrasion, bruising, laceration or bony deformity.  No proximal tenderness  Neurological: She is alert and oriented to person, place, and time.  She exhibits normal muscle tone. Coordination normal.  Skin: Skin is warm and dry.    ED Course  Procedures (including critical care time) Labs Reviewed - No data to display Dg Foot Complete Right  05/21/2013   *RADIOLOGY REPORT*  Clinical Data: Pain post fall yesterday  RIGHT FOOT COMPLETE - 3+ VIEW  Comparison: None.  Findings: Three views of the right foot submitted.  No acute fracture or subluxation.  There is linear  vague high density material within soft tissue adjacent to proximal phalanx great toe plantar aspect.  This is best seen in the lateral view measures about 3 mm.  Clinical correlation is necessary to exclude a foreign body.  IMPRESSION: No acute fracture or subluxation.  There is linear vague high density material within soft tissue adjacent to proximal phalanx great toe plantar aspect.  This is best seen in the lateral view measures about 3 mm.  Clinical correlation is necessary to exclude a foreign body.   Original Report Authenticated By: Natasha Mead, M.D.     MDM    Postop shoe applied, pain improved, remains neurovascularly intact.  Pain is likely related to contusion. There is no wound to the foot. No palpable foreign body, erythema, or edema. Pain is likely related to a contusion. Patient agrees to elevate and apply ice to her foot. Fifth referral for orthopedics.  Lacoya Wilbanks L. Trisha Mangle, PA-C 05/21/13 [phone]

## 2013-05-21 NOTE — ED Provider Notes (Signed)
Medical screening examination/treatment/procedure(s) were performed by non-physician practitioner and as supervising physician I was immediately available for consultation/collaboration.   Aviendha Azbell B. Bernette Mayers, MD 05/21/13 [phone]

## 2013-06-25 ENCOUNTER — Encounter (HOSPITAL_COMMUNITY): Payer: Self-pay | Admitting: *Deleted

## 2013-06-25 ENCOUNTER — Emergency Department (HOSPITAL_COMMUNITY)
Admission: EM | Admit: 2013-06-25 | Discharge: 2013-06-25 | Disposition: A | Discharge status: Home / Self Care | Payer: Medicaid Other | Attending: Emergency Medicine | Admitting: Emergency Medicine

## 2013-06-25 ENCOUNTER — Emergency Department (HOSPITAL_COMMUNITY): Payer: Medicaid Other

## 2013-06-25 DIAGNOSIS — R112 Nausea with vomiting, unspecified: Secondary | ICD-10-CM | POA: Insufficient documentation

## 2013-06-25 DIAGNOSIS — Z791 Long term (current) use of non-steroidal anti-inflammatories (NSAID): Secondary | ICD-10-CM | POA: Insufficient documentation

## 2013-06-25 DIAGNOSIS — F17200 Nicotine dependence, unspecified, uncomplicated: Secondary | ICD-10-CM | POA: Insufficient documentation

## 2013-06-25 DIAGNOSIS — K5289 Other specified noninfective gastroenteritis and colitis: Secondary | ICD-10-CM | POA: Insufficient documentation

## 2013-06-25 DIAGNOSIS — K529 Noninfective gastroenteritis and colitis, unspecified: Secondary | ICD-10-CM

## 2013-06-25 DIAGNOSIS — R109 Unspecified abdominal pain: Secondary | ICD-10-CM | POA: Insufficient documentation

## 2013-06-25 LAB — CBC WITH DIFFERENTIAL/PLATELET
Basophils Absolute: 0 10*3/uL (ref 0.0–0.1)
Basophils Relative: 0 % (ref 0–1)
HCT: 42 % (ref 36.0–46.0)
Hemoglobin: 14.3 g/dL (ref 12.0–15.0)
Lymphocytes Relative: 15 % (ref 12–46)
MCHC: 34 g/dL (ref 30.0–36.0)
Monocytes Absolute: 0.6 10*3/uL (ref 0.1–1.0)
Neutro Abs: 5 10*3/uL (ref 1.7–7.7)
Neutrophils Relative %: 74 % (ref 43–77)
RDW: 13.4 % (ref 11.5–15.5)
WBC: 6.8 10*3/uL (ref 4.0–10.5)

## 2013-06-25 LAB — BASIC METABOLIC PANEL
CO2: 28 mEq/L (ref 19–32)
Chloride: 101 mEq/L (ref 96–112)
GFR calc Af Amer: >90 mL/min (ref >90)
Potassium: 3.8 mEq/L (ref 3.5–5.1)

## 2013-06-25 MED ORDER — SODIUM CHLORIDE 0.9 % IV SOLN: INTRAVENOUS | Status: DC

## 2013-06-25 MED ORDER — ONDANSETRON 4 MG PO TBDP: 4.0000 mg | ORAL_TABLET | Freq: Three times a day (TID) | ORAL | Status: DC | PRN

## 2013-06-25 MED ORDER — LOPERAMIDE HCL 2 MG PO TABS: 2.0000 mg | ORAL_TABLET | Freq: Four times a day (QID) | ORAL | Status: DC | PRN

## 2013-06-25 MED ORDER — ONDANSETRON HCL 4 MG/2ML IJ SOLN
4.0000 mg | Freq: Once | INTRAMUSCULAR | Status: AC
  Administered 2013-06-25: 4 mg via INTRAVENOUS
  Filled 2013-06-25: qty 2

## 2013-06-25 MED ORDER — PROMETHAZINE HCL 25 MG PO TABS: 25.0000 mg | ORAL_TABLET | Freq: Four times a day (QID) | ORAL | Status: DC | PRN

## 2013-06-25 MED ORDER — HYDROMORPHONE HCL PF 1 MG/ML IJ SOLN
1.0000 mg | Freq: Once | INTRAMUSCULAR | Status: AC
  Administered 2013-06-25: 1 mg via INTRAVENOUS
  Filled 2013-06-25: qty 1

## 2013-06-25 MED ORDER — HYDROCODONE-ACETAMINOPHEN 5-325 MG PO TABS: 1.0000 | ORAL_TABLET | Freq: Four times a day (QID) | ORAL | Status: DC | PRN

## 2013-06-25 MED ORDER — SODIUM CHLORIDE 0.9 % IV BOLUS (SEPSIS)
1000.0000 mL | Freq: Once | INTRAVENOUS | Status: AC
  Administered 2013-06-25: 1000 mL via INTRAVENOUS

## 2013-06-25 NOTE — ED Notes (Signed)
x1 unsuccessful IV attempt made by Tarri Glenn RN. Lake Bells RN in room to attempt IV access.

## 2013-06-25 NOTE — ED Notes (Addendum)
Pt states vomiting and diarrhea since eating pizza at 2100 last night. Pt had to go to the restroom twice during triage assessment.

## 2013-06-25 NOTE — ED Provider Notes (Signed)
CSN: [id number]     Arrival date & time 06/25/13  5784 History  This chart was scribed for Jill Jakes, MD by Shari Heritage and Blanchard Kelch, ED Scribes. The patient was seen in room APA07/APA07. Patient's care was started at 7:37 AM.    Chief Complaint  Patient presents with  . Emesis  . Diarrhea    Patient is a 27 y.o. female presenting with vomiting. The history is provided by the patient. No language interpreter was used.  Emesis Severity:  Moderate Duration:  6 hours Timing:  Constant Quality:  Stomach contents Progression:  Unchanged Chronicity:  New Relieved by:  Nothing Associated symptoms: abdominal pain and diarrhea   Associated symptoms: no chills, no cough, no fever, no headaches, no myalgias and no sore throat   Abdominal pain:    Location:  Generalized   Quality:  Cramping   Severity:  Severe Diarrhea:    Quality:  Watery   Duration:  7 hours   Timing:  Constant   Progression:  Unchanged   HPI Comments: Jill Kramer is a 27 y.o. female who presents to the Emergency Department complaining of constant, non-bloody emesis of stomach contents onset 5-6 hours ago. Patient's other symptoms include non-blood diarrhea which began at 12 AM this morning, generalized abdominal cramping and nausea. She rates abdominal cramping as 8/10. She states that symptoms are unchanged. Patient reports that she had pizza last night for dinner at about 9 PM. She reports that several other family members ate the same pizza and have had vomiting, except for her son who has pnly experienced mild abdominal pain. She denies fever, chills, visual changes, headaches, body aches, cough, chest pain, shortness of breath, congestion, headache, leg swelling, neck pain, visual changes, rash, history of bleeding easily, syncope and dysuria.  She denies any chance that she may be pregnant. She has a surgical history of tubal ligation. She is a current every day smoker.   History reviewed. No  pertinent past medical history. Past Surgical History  Procedure Laterality Date  . Tubal ligation    . Tonsillectomy    . Cesarean section     No family history on file. History  Substance Use Topics  . Smoking status: Current Every Day Smoker -- 0.30 packs/day    Types: Cigarettes  . Smokeless tobacco: Not on file  . Alcohol Use: Yes     Comment: Occ   OB History   Grav Para Term Preterm Abortions TAB SAB Ect Mult Living                 Review of Systems  Constitutional: Negative for fever and chills.  HENT: Negative for congestion, sore throat and neck pain.   Eyes: Negative for visual disturbance.  Respiratory: Negative for cough and shortness of breath.   Cardiovascular: Negative for chest pain and leg swelling.  Gastrointestinal: Positive for nausea, vomiting, abdominal pain and diarrhea.  Genitourinary: Negative for dysuria.  Musculoskeletal: Negative for myalgias.  Skin: Negative for rash.  Neurological: Negative for syncope and headaches.  Hematological: Does not bruise/bleed easily.    Allergies  Review of patient's allergies indicates no known allergies.  Home Medications   Current Outpatient Rx  Name  Route  Sig  Dispense  Refill  . acetaminophen (TYLENOL) 325 MG tablet   Oral   Take 650 mg by mouth every 6 (six) hours as needed for pain.         Marland Kitchen HYDROcodone-acetaminophen (NORCO/VICODIN) 5-325 MG per  tablet      Take one-two tabs po q 4-6 hrs prn pain   10 tablet   0   . HYDROcodone-acetaminophen (NORCO/VICODIN) 5-325 MG per tablet   Oral   Take 1-2 tablets by mouth every 6 (six) hours as needed for pain.   10 tablet   0   . loperamide (IMODIUM A-D) 2 MG tablet   Oral   Take 1 tablet (2 mg total) by mouth 4 (four) times daily as needed for diarrhea or loose stools.   30 tablet   0   . naproxen (NAPROSYN) 500 MG tablet   Oral   Take 1 tablet (500 mg total) by mouth 2 (two) times daily with a meal.   20 tablet   0   . ondansetron  (ZOFRAN ODT) 4 MG disintegrating tablet   Oral   Take 1 tablet (4 mg total) by mouth every 8 (eight) hours as needed.   10 tablet   0   . promethazine (PHENERGAN) 25 MG tablet   Oral   Take 1 tablet (25 mg total) by mouth every 6 (six) hours as needed for nausea.   12 tablet   0    Triage Vitals: BP 117/86  Pulse 84  Temp(Src) 98.7 F (37.1 C) (Oral)  SpO2 99%  LMP 06/11/2013 Physical Exam  Constitutional: She is oriented to person, place, and time. She appears well-developed and well-nourished.  HENT:  Head: Normocephalic and atraumatic.  Mouth/Throat: Mucous membranes are dry.  Eyes: EOM are normal.  Cardiovascular: Normal rate and regular rhythm.   Pulmonary/Chest: Effort normal and breath sounds normal. No respiratory distress.  Abdominal: Soft. Bowel sounds are normal. She exhibits no distension. There is no tenderness.  Musculoskeletal: Normal range of motion. She exhibits no edema.  No pedal edema.  Neurological: She is alert and oriented to person, place, and time.  Skin: Skin is warm and dry.    ED Course  DIAGNOSTIC STUDIES: Oxygen Saturation is 99% on room air, normal by my interpretation.    COORDINATION OF CARE: 7:45 AM- Will order 1000 ml of IV fluids, Zofran and Dilaudid. Will also order CBC, BMP and x-ray of abdomen to rule out bowel obstruction. Patient informed of current plan for treatment and evaluation and agrees with plan at this time.    Procedures (including critical care time)  Results for orders placed during the hospital encounter of 06/25/13  CBC WITH DIFFERENTIAL      Result Value Range   WBC 6.8  4.0 - 10.5 K/uL   RBC 4.64  3.87 - 5.11 MIL/uL   Hemoglobin 14.3  12.0 - 15.0 g/dL   HCT 45.4  09.8 - 11.9 %   MCV 90.5  78.0 - 100.0 fL   MCH 30.8  26.0 - 34.0 pg   MCHC 34.0  30.0 - 36.0 g/dL   RDW 14.7  82.9 - 56.2 %   Platelets 264  150 - 400 K/uL   Neutrophils Relative % 74  43 - 77 %   Neutro Abs 5.0  1.7 - 7.7 K/uL   Lymphocytes  Relative 15  12 - 46 %   Lymphs Abs 1.0  0.7 - 4.0 K/uL   Monocytes Relative 9  3 - 12 %   Monocytes Absolute 0.6  0.1 - 1.0 K/uL   Eosinophils Relative 3  0 - 5 %   Eosinophils Absolute 0.2  0.0 - 0.7 K/uL   Basophils Relative 0  0 - 1 %  Basophils Absolute 0.0  0.0 - 0.1 K/uL  BASIC METABOLIC PANEL      Result Value Range   Sodium 137  135 - 145 mEq/L   Potassium 3.8  3.5 - 5.1 mEq/L   Chloride 101  96 - 112 mEq/L   CO2 28  19 - 32 mEq/L   Glucose, Bld 88  70 - 99 mg/dL   BUN 12  6 - 23 mg/dL   Creatinine, Ser 1.61  0.50 - 1.10 mg/dL   Calcium 9.4  8.4 - 09.6 mg/dL   GFR calc non Af Amer >90  >90 mL/min   GFR calc Af Amer >90  >90 mL/min   Results for orders placed during the hospital encounter of 06/25/13  CBC WITH DIFFERENTIAL      Result Value Range   WBC 6.8  4.0 - 10.5 K/uL   RBC 4.64  3.87 - 5.11 MIL/uL   Hemoglobin 14.3  12.0 - 15.0 g/dL   HCT 04.5  40.9 - 81.1 %   MCV 90.5  78.0 - 100.0 fL   MCH 30.8  26.0 - 34.0 pg   MCHC 34.0  30.0 - 36.0 g/dL   RDW 91.4  78.2 - 95.6 %   Platelets 264  150 - 400 K/uL   Neutrophils Relative % 74  43 - 77 %   Neutro Abs 5.0  1.7 - 7.7 K/uL   Lymphocytes Relative 15  12 - 46 %   Lymphs Abs 1.0  0.7 - 4.0 K/uL   Monocytes Relative 9  3 - 12 %   Monocytes Absolute 0.6  0.1 - 1.0 K/uL   Eosinophils Relative 3  0 - 5 %   Eosinophils Absolute 0.2  0.0 - 0.7 K/uL   Basophils Relative 0  0 - 1 %   Basophils Absolute 0.0  0.0 - 0.1 K/uL  BASIC METABOLIC PANEL      Result Value Range   Sodium 137  135 - 145 mEq/L   Potassium 3.8  3.5 - 5.1 mEq/L   Chloride 101  96 - 112 mEq/L   CO2 28  19 - 32 mEq/L   Glucose, Bld 88  70 - 99 mg/dL   BUN 12  6 - 23 mg/dL   Creatinine, Ser 2.13  0.50 - 1.10 mg/dL   Calcium 9.4  8.4 - 08.6 mg/dL   GFR calc non Af Amer >90  >90 mL/min   GFR calc Af Amer >90  >90 mL/min      Dg Abd Acute W/chest  06/25/2013   *RADIOLOGY REPORT*  Clinical Data: Emesis and diarrhea  ACUTE ABDOMEN SERIES (ABDOMEN  2 VIEW & CHEST 1 VIEW)  Comparison: None.  Findings: The heart and pulmonary vascularity are within normal limits.  The lungs are clear bilaterally.  Scattered large and small bowel gas is noted.  No obstructive changes are seen.  No free air is noted.  No abnormal mass is seen. No bony abnormality is noted.  IMPRESSION: Nonspecific chest and abdomen.   Original Report Authenticated By: Alcide Clever, M.D.    1. Gastroenteritis     MDM  Symptoms most likely consistent with either viral gastroenteritis or mild food poisoning. Swelling of the family members to get sick as well. Patient here is nontoxic no acute distress. No sniffing and leukocytosis no electrolyte abnormalities kidney function is normal. Abdominal series shows no significant pathology no evidence of bowel structure. Patient feels improved here with medications and fluids. Will discharge  home with symptomatic treatment.      I personally performed the services described in this documentation, which was scribed in my presence. The recorded information has been reviewed and is accurate.     Jill Jakes, MD 06/25/13 [phone]

## 2013-10-14 ENCOUNTER — Ambulatory Visit: Payer: Self-pay | Admitting: Adult Health

## 2014-08-31 ENCOUNTER — Encounter (HOSPITAL_COMMUNITY): Payer: Self-pay | Admitting: Emergency Medicine

## 2014-08-31 ENCOUNTER — Emergency Department (HOSPITAL_COMMUNITY)
Admission: EM | Admit: 2014-08-31 | Discharge: 2014-08-31 | Disposition: A | Discharge status: Home / Self Care | Payer: Medicaid Other | Attending: Emergency Medicine | Admitting: Emergency Medicine

## 2014-08-31 DIAGNOSIS — Z720 Tobacco use: Secondary | ICD-10-CM | POA: Diagnosis not present

## 2014-08-31 DIAGNOSIS — H0011 Chalazion right upper eyelid: Secondary | ICD-10-CM | POA: Insufficient documentation

## 2014-08-31 DIAGNOSIS — H5711 Ocular pain, right eye: Secondary | ICD-10-CM | POA: Diagnosis present

## 2014-08-31 MED ORDER — HYDROCODONE-ACETAMINOPHEN 5-325 MG PO TABS: 1.0000 | ORAL_TABLET | ORAL | Status: DC | PRN

## 2014-08-31 MED ORDER — KETOROLAC TROMETHAMINE 10 MG PO TABS
10.0000 mg | ORAL_TABLET | Freq: Once | ORAL | Status: AC
  Administered 2014-08-31: 10 mg via ORAL
  Filled 2014-08-31: qty 1

## 2014-08-31 MED ORDER — TOBRAMYCIN 0.3 % OP SOLN
2.0000 [drp] | Freq: Once | OPHTHALMIC | Status: AC
  Administered 2014-08-31: 2 [drp] via OTIC
  Filled 2014-08-31: qty 5

## 2014-08-31 MED ORDER — MELOXICAM 15 MG PO TABS: 15.0000 mg | ORAL_TABLET | Freq: Every day | ORAL | Status: DC

## 2014-08-31 NOTE — ED Provider Notes (Signed)
CSN: [id number]     Arrival date & time 08/31/14  1146 History  This chart was scribed for non-physician practitioner Ivery QualeHobson Kolbe Delmonaco, PA-C, working with Layla MawKristen N Ward, DO by Littie Deedsichard Sun, ED Scribe. This patient was seen in room APFT24/APFT24 and the patient's care was started at 12:54 PM.    No chief complaint on file. Chief Complaint: Eye pain/stye    The history is provided by the patient. No language interpreter was used.   HPI Comments: [name] is a 28 y.o. female who presents to the Emergency Department complaining of gradual onset, constant right eye pain in her upper eyelid with swelling and occasional discharge that began about 3 weeks ago. She reports that she has a stye on her right eye. Patient states she has had blurry vision since yesterday and that her right side of the face feels hotter than usual. She has been using OTC eyedrops and warm rags for her symptoms. She denies having had any surgeries on her eyes. NKDA and she denies any medical problems currently.   History reviewed. No pertinent past medical history. Past Surgical History  Procedure Laterality Date  . Tubal ligation    . Tonsillectomy    . Cesarean section     History reviewed. No pertinent family history. History  Substance Use Topics  . Smoking status: Current Every Day Smoker -- 0.30 packs/day    Types: Cigarettes  . Smokeless tobacco: Not on file  . Alcohol Use: Yes     Comment: Occ   OB History   Grav Para Term Preterm Abortions TAB SAB Ect Mult Living                 Review of Systems  Constitutional: Negative for fever and chills.  Eyes: Positive for pain, discharge and visual disturbance.  All other systems reviewed and are negative.     Allergies  Review of patient's allergies indicates no known allergies.  Home Medications   Prior to Admission medications   Not on File   BP 121/73  Pulse 74  Temp(Src) 99.1 F (37.3 C) (Oral)  Resp 18  SpO2 100%  LMP  08/06/2014 Physical Exam  Nursing note and vitals reviewed. Constitutional: She is oriented to person, place, and time. She appears well-developed and well-nourished. No distress.  HENT:  Head: Normocephalic and atraumatic.  Mouth/Throat: Oropharynx is clear and moist. No oropharyngeal exudate.  Eyes: EOM are normal. Pupils are equal, round, and reactive to light. Right conjunctiva is injected.  Mild swelling of the right upper lid. Stye of the right upper lid with mild exudate. Injected conjunctiva on the right eye and injected bulbar conjunctiva on the right eye.  Neck: Neck supple.  Cardiovascular: Normal rate, regular rhythm and normal heart sounds.   No murmur heard. Pulmonary/Chest: Effort normal and breath sounds normal. No respiratory distress. She has no wheezes. She has no rales.  Musculoskeletal: She exhibits no edema.  Neurological: She is alert and oriented to person, place, and time. No cranial nerve deficit.  Skin: Skin is warm and dry. No rash noted.  Psychiatric: She has a normal mood and affect. Her behavior is normal.    ED Course  Procedures DIAGNOSTIC STUDIES: Oxygen Saturation is 100% on room air, normal by my interpretation.    COORDINATION OF CARE: 12:59 PM-Discussed treatment plan which includes medications with pt at bedside and pt agreed to plan.    Labs Review Labs Reviewed - No data to display  Imaging Review  No results found.   EKG Interpretation None      MDM  Exam is consistent with chalazion. No hx of injury. Plan - tobramycin eye drops given. Rx for mobic and norco given to the patient. Pt to see Dr Larry SierrasHanes - opthalmology for evaluation and management if not improving.   Final diagnoses:  Chalazion of right upper eyelid    *I have reviewed nursing notes, vital signs, and all appropriate lab and imaging results for this patient.**  **I personally performed the services described in this documentation, which was scribed in my presence.  The recorded information has been reviewed and is accurate.Kathie Dike*   Anoushka Divito M Lun Muro, PA-C 09/03/14 1138

## 2014-08-31 NOTE — ED Notes (Signed)
Rt upper eye lid swollen and painful for 3 weeks with d/c at times

## 2014-08-31 NOTE — Discharge Instructions (Signed)
Please apply warm compress to the right eye 2 or 3 times daily. Use 2 tobrex eye drops every 4 hours for 5 to 7 days. Use mobic daily with food. Use norco for pain if needed. This medication may cause drowsiness. Please do not drink, drive, or participate in activity that requires concentration while taking this medication. Please see Dr Lita MainsHaines if not improving. Chalazion A chalazion is a swelling or hard lump on the eyelid caused by a blocked oil gland. Chalazions may occur on the upper or the lower eyelid.  CAUSES  Oil gland in the eyelid becomes blocked. SYMPTOMS   Swelling or hard lump on the eyelid. This lump may make it hard to see out of the eye.  The swelling may spread to areas around the eye. TREATMENT   Although some chalazions disappear by themselves in 1 or 2 months, some chalazions may need to be removed.  Medicines to treat an infection may be required. HOME CARE INSTRUCTIONS   Wash your hands often and dry them with a clean towel. Do not touch the chalazion.  Apply heat to the eyelid several times a day for 10 minutes to help ease discomfort and bring any yellowish white fluid (pus) to the surface. One way to apply heat to a chalazion is to use the handle of a metal spoon.  Hold the handle under hot water until it is hot, and then wrap the handle in paper towels so that the heat can come through without burning your skin.  Hold the wrapped handle against the chalazion and reheat the spoon handle as needed.  Apply heat in this fashion for 10 minutes, 4 times per day.  Return to your caregiver to have the pus removed if it does not break (rupture) on its own.  Do not try to remove the pus yourself by squeezing the chalazion or sticking it with a pin or needle.  Only take over-the-counter or prescription medicines for pain, discomfort, or fever as directed by your caregiver. SEEK IMMEDIATE MEDICAL CARE IF:   You have pain in your eye.  Your vision changes.  The  chalazion does not go away.  The chalazion becomes painful, red, or swollen, grows larger, or does not start to disappear after 2 weeks. MAKE SURE YOU:   Understand these instructions.  Will watch your condition.  Will get help right away if you are not doing well or get worse. Document Released: 10/20/2000 Document Revised: 01/15/2012 Document Reviewed: 02/07/2010 [facility] Patient Information 2015 [place], [place]. This information is not intended to replace advice given to you by your health care provider. Make sure you discuss any questions you have with your health care provider.

## 2014-09-03 NOTE — ED Provider Notes (Signed)
Medical screening examination/treatment/procedure(s) were performed by non-physician practitioner and as supervising physician I was immediately available for consultation/collaboration.   EKG Interpretation None        Kristen N Ward, DO 09/03/14 1221 

## 2015-11-29 ENCOUNTER — Emergency Department (HOSPITAL_COMMUNITY)
Admission: EM | Admit: 2015-11-29 | Discharge: 2015-11-29 | Disposition: A | Discharge status: Home / Self Care | Payer: Medicaid Other | Attending: Emergency Medicine | Admitting: Emergency Medicine

## 2015-11-29 ENCOUNTER — Encounter (HOSPITAL_COMMUNITY): Payer: Self-pay | Admitting: *Deleted

## 2015-11-29 DIAGNOSIS — Z791 Long term (current) use of non-steroidal anti-inflammatories (NSAID): Secondary | ICD-10-CM | POA: Insufficient documentation

## 2015-11-29 DIAGNOSIS — R110 Nausea: Secondary | ICD-10-CM | POA: Insufficient documentation

## 2015-11-29 DIAGNOSIS — Z9851 Tubal ligation status: Secondary | ICD-10-CM | POA: Insufficient documentation

## 2015-11-29 DIAGNOSIS — Z3202 Encounter for pregnancy test, result negative: Secondary | ICD-10-CM | POA: Insufficient documentation

## 2015-11-29 DIAGNOSIS — F17210 Nicotine dependence, cigarettes, uncomplicated: Secondary | ICD-10-CM | POA: Insufficient documentation

## 2015-11-29 DIAGNOSIS — R61 Generalized hyperhidrosis: Secondary | ICD-10-CM | POA: Insufficient documentation

## 2015-11-29 DIAGNOSIS — R109 Unspecified abdominal pain: Secondary | ICD-10-CM

## 2015-11-29 LAB — COMPREHENSIVE METABOLIC PANEL
ALBUMIN: 4.1 g/dL (ref 3.5–5.0)
ALT: 13 U/L — AB (ref 14–54)
AST: 17 U/L (ref 15–41)
Alkaline Phosphatase: 78 U/L (ref 38–126)
Anion gap: 7 (ref 5–15)
BILIRUBIN TOTAL: 0.5 mg/dL (ref 0.3–1.2)
BUN: 14 mg/dL (ref 6–20)
CO2: 27 mmol/L (ref 22–32)
CREATININE: 0.74 mg/dL (ref 0.44–1.00)
Calcium: 9.2 mg/dL (ref 8.9–10.3)
Chloride: 104 mmol/L (ref 101–111)
GFR calc Af Amer: >60 mL/min (ref >60)
GLUCOSE: 89 mg/dL (ref 65–99)
POTASSIUM: 4.3 mmol/L (ref 3.5–5.1)
Sodium: 138 mmol/L (ref 135–145)
TOTAL PROTEIN: 7.6 g/dL (ref 6.5–8.1)

## 2015-11-29 LAB — CBC WITH DIFFERENTIAL/PLATELET
BASOS ABS: 0 10*3/uL (ref 0.0–0.1)
Basophils Relative: 0 %
EOS ABS: 0.1 10*3/uL (ref 0.0–0.7)
EOS PCT: 2 %
HCT: 43.8 % (ref 36.0–46.0)
Hemoglobin: 14.7 g/dL (ref 12.0–15.0)
LYMPHS ABS: 1.7 10*3/uL (ref 0.7–4.0)
LYMPHS PCT: 23 %
MCH: 30.4 pg (ref 26.0–34.0)
MCHC: 33.6 g/dL (ref 30.0–36.0)
MCV: 90.7 fL (ref 78.0–100.0)
MONO ABS: 0.4 10*3/uL (ref 0.1–1.0)
Monocytes Relative: 6 %
Neutro Abs: 4.9 10*3/uL (ref 1.7–7.7)
Neutrophils Relative %: 69 %
PLATELETS: 323 10*3/uL (ref 150–400)
RBC: 4.83 MIL/uL (ref 3.87–5.11)
RDW: 13.4 % (ref 11.5–15.5)
WBC: 7.1 10*3/uL (ref 4.0–10.5)

## 2015-11-29 LAB — LIPASE, BLOOD: LIPASE: 26 U/L (ref 11–51)

## 2015-11-29 LAB — URINALYSIS, ROUTINE W REFLEX MICROSCOPIC
BILIRUBIN URINE: NEGATIVE
GLUCOSE, UA: NEGATIVE mg/dL
Leukocytes, UA: NEGATIVE
Nitrite: NEGATIVE
PH: 6 (ref 5.0–8.0)
Protein, ur: NEGATIVE mg/dL
Specific Gravity, Urine: >1.03 — ABNORMAL HIGH (ref 1.005–1.030)

## 2015-11-29 LAB — URINE MICROSCOPIC-ADD ON

## 2015-11-29 LAB — PREGNANCY, URINE: Preg Test, Ur: NEGATIVE

## 2015-11-29 NOTE — ED Provider Notes (Signed)
CSN: [id number]     Arrival date & time 11/29/15  1706 History  By signing my name below, I, Jill Kramer, attest that this documentation has been prepared under the direction and in the presence of No att. providers found. Electronically Signed: Linus Kramer, ED Scribe. 11/29/2015. 12:18 AM.   Chief Complaint  Patient presents with  . Abdominal Pain   The history is provided by the patient. No language interpreter was used.   HPI Comments: Jill Kramer is a 30 y.o. female without any pertinent PMHx presents to the Emergency Department complaining of gradual onset of intermittent abdominal pain that began while at work 5 hours ago that has since resolved. Pt describes the pain as sharp and contraction-like. At the time of episode, pt states that the pain radiates to her back. Pt states that the pain was exacerbated with movement without any alleviating factors. Pt also reports associated nausea and diaphoresis that has resolved. Pt denies vomiting, diarrhea, dysuria, hematuria, constipation, CP, SOB or any other associated sx at this time. Pt states that she had a normal BM today. She denies and current pregnancy. Pt states she had her menstrual period earlier this month. Pt denies hx of kidney stones. As per chart review, Pt had a CT and MRI performed in 2013 which revealed a liver mass. As per pt, the liver mass did not require any intervention.  History reviewed. No pertinent past medical history. Past Surgical History  Procedure Laterality Date  . Tubal ligation    . Tonsillectomy    . Cesarean section     No family history on file. Social History  Substance Use Topics  . Smoking status: Current Every Day Smoker -- 0.30 packs/day    Types: Cigarettes  . Smokeless tobacco: None  . Alcohol Use: Yes     Comment: Occ   OB History    No data available     Review of Systems  Constitutional: Positive for diaphoresis.  Respiratory: Negative for cough and shortness of breath.    Cardiovascular: Negative for chest pain.  Gastrointestinal: Positive for nausea and abdominal pain. Negative for vomiting, diarrhea and constipation.  Neurological: Negative for headaches.  Psychiatric/Behavioral: Negative for confusion.  All other systems reviewed and are negative.     Allergies  Review of patient's allergies indicates no known allergies.  Home Medications   Prior to Admission medications   Medication Sig Start Date End Date Taking? Authorizing Provider  HYDROcodone-acetaminophen (NORCO/VICODIN) 5-325 MG per tablet Take 1 tablet by mouth every 4 (four) hours as needed. 08/31/14   Ivery Quale, PA-C  meloxicam (MOBIC) 15 MG tablet Take 1 tablet (15 mg total) by mouth daily. 08/31/14   Ivery Quale, PA-C   BP 123/77 mmHg  Pulse 69  Temp(Src) 98.2 F (36.8 C) (Oral)  Resp 16  Ht  (1.6 m)  Wt 185 lb (83.915 kg)  BMI 32.78 kg/m2  SpO2 100%  LMP 11/09/2015 Physical Exam  Constitutional: She is oriented to person, place, and time. She appears well-developed and well-nourished. No distress.  HENT:  Head: Normocephalic and atraumatic.  Mouth/Throat: Oropharynx is clear and moist. No oropharyngeal exudate.  Eyes: Conjunctivae and EOM are normal. Pupils are equal, round, and reactive to light.  Neck: Normal range of motion. Neck supple.  No meningismus.  Cardiovascular: Normal rate, regular rhythm, normal heart sounds and intact distal pulses.   No murmur heard. Pulmonary/Chest: Effort normal and breath sounds normal. No respiratory distress.  Abdominal: Soft. There  is no tenderness. There is no rebound and no guarding.  Genitourinary:  No CVA tenderness  Musculoskeletal: Normal range of motion. She exhibits no edema or tenderness.  Neurological: She is alert and oriented to person, place, and time. No cranial nerve deficit. She exhibits normal muscle tone. Coordination normal.  No ataxia on finger to nose bilaterally. No pronator drift. 5/5 strength  throughout. CN 2-12 intact.Equal grip strength. Sensation intact.   Skin: Skin is warm.  Psychiatric: She has a normal mood and affect. Her behavior is normal.  Nursing note and vitals reviewed.   ED Course  Procedures  DIAGNOSTIC STUDIES: Oxygen Saturation is 99% on room air, normal by my interpretation.    COORDINATION OF CARE: 12:18 AM Discussed treatment plan with pt at bedside and pt agreed to plan.   Labs Review Labs Reviewed  COMPREHENSIVE METABOLIC PANEL - Abnormal; Notable for the following:    ALT 13 (*)    All other components within normal limits  URINALYSIS, ROUTINE W REFLEX MICROSCOPIC (NOT AT [facility]) - Abnormal; Notable for the following:    Specific Gravity, Urine >1.030 (*)    Hgb urine dipstick TRACE (*)    Ketones, ur TRACE (*)    All other components within normal limits  URINE MICROSCOPIC-ADD ON - Abnormal; Notable for the following:    Squamous Epithelial / LPF 0-5 (*)    Bacteria, UA RARE (*)    All other components within normal limits  CBC WITH DIFFERENTIAL/PLATELET  LIPASE, BLOOD  PREGNANCY, URINE    Imaging Review No results found.    EKG Interpretation None      MDM   Final diagnoses:  Abdominal pain, unspecified abdominal location   Episode of abdominal pain onset while she was at work around 3 PM. Felt like "contractions" and like she needed to have a bowel movement. Pain has since resolved on its own and she feels 100% at baseline without pain.  No fevers, chills, nausea, vomiting, urinary or vaginal symptoms. HCG negative. Triage urinalysis is negative. Labs are unremarkable. Abdomen is completely soft and nontender.  Patient states she feels fine and wants to go home. Abdomen is soft and she is eating a snack with out difficulty.  Appears stable for discharge. Follow up with PCP> return precautions discussed.    I personally performed the services described in this documentation, which was scribed in my presence. The recorded  information has been reviewed and is accurate.   Glynn Octave, MD 11/30/15 [phone]

## 2015-11-29 NOTE — Discharge Instructions (Signed)

## 2015-11-29 NOTE — ED Notes (Signed)
Pt states abdominal pain began suddenly while at work. Pt states she kept feeling like she needed to have a BM but never could. Pt states she has been breaking out in sweats also. Pain mostly to right and mid abdomen.

## 2016-05-12 ENCOUNTER — Emergency Department (HOSPITAL_COMMUNITY): Payer: No Typology Code available for payment source

## 2016-05-12 ENCOUNTER — Emergency Department (HOSPITAL_COMMUNITY)
Admission: EM | Admit: 2016-05-12 | Discharge: 2016-05-12 | Disposition: A | Discharge status: Home / Self Care | Payer: No Typology Code available for payment source | Attending: Emergency Medicine | Admitting: Emergency Medicine

## 2016-05-12 ENCOUNTER — Encounter (HOSPITAL_COMMUNITY): Payer: Self-pay | Admitting: *Deleted

## 2016-05-12 DIAGNOSIS — Y999 Unspecified external cause status: Secondary | ICD-10-CM | POA: Diagnosis not present

## 2016-05-12 DIAGNOSIS — T148 Other injury of unspecified body region: Secondary | ICD-10-CM | POA: Insufficient documentation

## 2016-05-12 DIAGNOSIS — Y9389 Activity, other specified: Secondary | ICD-10-CM | POA: Diagnosis not present

## 2016-05-12 DIAGNOSIS — T148XXA Other injury of unspecified body region, initial encounter: Secondary | ICD-10-CM

## 2016-05-12 DIAGNOSIS — Y92410 Unspecified street and highway as the place of occurrence of the external cause: Secondary | ICD-10-CM | POA: Diagnosis not present

## 2016-05-12 DIAGNOSIS — R51 Headache: Secondary | ICD-10-CM | POA: Diagnosis not present

## 2016-05-12 DIAGNOSIS — F17210 Nicotine dependence, cigarettes, uncomplicated: Secondary | ICD-10-CM | POA: Insufficient documentation

## 2016-05-12 DIAGNOSIS — M25551 Pain in right hip: Secondary | ICD-10-CM | POA: Insufficient documentation

## 2016-05-12 DIAGNOSIS — M25561 Pain in right knee: Secondary | ICD-10-CM | POA: Diagnosis present

## 2016-05-12 LAB — POC URINE PREG, ED: Preg Test, Ur: NEGATIVE

## 2016-05-12 MED ORDER — IBUPROFEN 600 MG PO TABS: 600.0000 mg | ORAL_TABLET | Freq: Four times a day (QID) | ORAL | Status: DC | PRN

## 2016-05-12 MED ORDER — IBUPROFEN 800 MG PO TABS
800.0000 mg | ORAL_TABLET | Freq: Once | ORAL | Status: AC
  Administered 2016-05-12: 800 mg via ORAL
  Filled 2016-05-12: qty 1
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
## 2016-05-12 NOTE — Discharge Instructions (Signed)
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
## 2016-10-11 ENCOUNTER — Emergency Department (HOSPITAL_COMMUNITY)
Admission: EM | Admit: 2016-10-11 | Discharge: 2016-10-11 | Disposition: A | Discharge status: Home / Self Care | Payer: Self-pay | Attending: Emergency Medicine | Admitting: Emergency Medicine

## 2016-10-11 ENCOUNTER — Encounter (HOSPITAL_COMMUNITY): Payer: Self-pay | Admitting: Emergency Medicine

## 2016-10-11 DIAGNOSIS — L03012 Cellulitis of left finger: Secondary | ICD-10-CM | POA: Insufficient documentation

## 2016-10-11 DIAGNOSIS — F17210 Nicotine dependence, cigarettes, uncomplicated: Secondary | ICD-10-CM | POA: Insufficient documentation

## 2016-10-11 MED ORDER — HYDROCODONE-ACETAMINOPHEN 5-325 MG PO TABS: 1.0000 | ORAL_TABLET | ORAL | 0 refills | Status: DC | PRN

## 2016-10-11 MED ORDER — CEPHALEXIN 500 MG PO CAPS: 500.0000 mg | ORAL_CAPSULE | Freq: Four times a day (QID) | ORAL | 0 refills | Status: DC

## 2016-10-11 MED ORDER — LIDOCAINE HCL (PF) 2 % IJ SOLN
2.0000 mL | Freq: Once | INTRAMUSCULAR | Status: AC
  Administered 2016-10-11: 2 mL
  Filled 2016-10-11: qty 10

## 2016-10-11 NOTE — ED Notes (Signed)
Pt alert & oriented x4, stable gait. Patient given discharge instructions, paperwork & prescription(s). Patient informed not to drive, operate any equipment & handel any important documents 4 hours after taking pain medication. Patient  instructed to stop at the registration desk to finish any additional paperwork. Patient  verbalized understanding. Pt left department w/ no further questions. 

## 2016-10-11 NOTE — Discharge Instructions (Signed)
Take entire course of antibiotic.  Soak your finger in warm water bath twice daily for 10 minutes.You may take the hydrocodone  prescribed for pain relief.  This will make you drowsy - do not drive within 4 hours of taking this medication.  Get rechecked if her symptoms are not improving with this treatment.

## 2016-10-11 NOTE — ED Notes (Signed)
Pt complaining of left ring finger hurting for the past 3 days. Noted redness around the tip of the finger. Pt nails are short & says does bit them at times.

## 2016-10-11 NOTE — ED Triage Notes (Signed)
Pain to left ring finger for last 3 days.  Rates pain 4/10.

## 2016-10-13 NOTE — ED Provider Notes (Signed)
[facility]-EMERGENCY DEPT Provider Note   CSN: [id number] Arrival date & time: 10/11/16  1227     History   Chief Complaint Chief Complaint  Patient presents with  . Hand Pain    left ring finger    HPI Jill Kramer is a 30 y.o. ambidextrous female presenting with a 3 day history of pain and swelling along her cuticle edge of her left right finger.  She denies injury but believes there may be an infection in the finger.  She does endorse frequently biting her nails.  There is no radiation of pain.  She denies numbness in the finger and has no other complaint.  She has had no treatment prior to arrival. She works in [employer].  The history is provided by the patient.    History reviewed. No pertinent past medical history.  There are no active problems to display for this patient.   Past Surgical History:  Procedure Laterality Date  . CESAREAN SECTION    . TONSILLECTOMY    . TUBAL LIGATION      OB History    No data available       Home Medications    Prior to Admission medications   Medication Sig Start Date End Date Taking? Authorizing Provider  cephALEXin (KEFLEX) 500 MG capsule Take 1 capsule (500 mg total) by mouth 4 (four) times daily. 10/11/16   Burgess AmorJulie Vickye Astorino, PA-C  HYDROcodone-acetaminophen (NORCO/VICODIN) 5-325 MG tablet Take 1 tablet by mouth every 4 (four) hours as needed for moderate pain. 10/11/16   Burgess AmorJulie Chazz Philson, PA-C  ibuprofen (ADVIL,MOTRIN) 600 MG tablet Take 1 tablet (600 mg total) by mouth every 6 (six) hours as needed. 05/12/16   Burgess AmorJulie AmeLie Hollars, PA-C    Family History History reviewed. No pertinent family history.  Social History Social History  Substance Use Topics  . Smoking status: Current Every Day Smoker    Packs/day: 0.30    Types: Cigarettes  . Smokeless tobacco: Never Used  . Alcohol use Yes     Comment: Occ     Allergies   Patient has no known allergies.   Review of Systems Review of Systems  Constitutional: Negative for chills and  fever.  Respiratory: Negative for shortness of breath and wheezing.   Skin: Positive for color change and wound.  Neurological: Negative for numbness.     Physical Exam Updated Vital Signs BP 132/89 (BP Location: Left Arm)   Pulse 80   Temp 97.6 F (36.4 C)   Resp 16   Ht 5\' 3"  (1.6 m)   Wt 106.6 kg   LMP 09/22/2016 (Exact Date)   SpO2 100%   BMI 41.63 kg/m   Physical Exam  Constitutional: She appears well-developed and well-nourished. No distress.  HENT:  Head: Normocephalic.  Neck: Neck supple.  Cardiovascular: Normal rate.   Pulmonary/Chest: Effort normal.  Musculoskeletal: Normal range of motion. She exhibits no edema.  Skin:  Erythema and fluctuance with visible pus pocket within the lateral nail fold.  No drainage.  Distal sensation hyperacute. No red streaking.     ED Treatments / Results  Labs (all labs ordered are listed, but only abnormal results are displayed) Labs Reviewed - No data to display  EKG  EKG Interpretation None       Radiology No results found.  Procedures Procedures (including critical care time)  INCISION AND DRAINAGE Performed by: Burgess AmorIDOL, Elna Radovich Consent: Verbal consent obtained. Risks and benefits: risks, benefits and alternatives were discussed Type: abscess  Body  area: left ring finger  Anesthesia: digital block  Cuticle lifted from nail plate  Local anesthetic: lidocaine 2% without epinephrine  Anesthetic total: 2 ml  Complexity: complex Blunt dissection to break up loculations  Drainage: purulent  Drainage amount: small  Packing material: none Patient tolerance: Patient tolerated the procedure well with no immediate complications.      Medications Ordered in ED Medications  lidocaine (XYLOCAINE) 2 % injection 2 mL (2 mLs Other Given by Other 10/11/16 1333)     Initial Impression / Assessment and Plan / ED Course  I have reviewed the triage vital signs and the nursing notes.  Pertinent labs & imaging  results that were available during my care of the patient were reviewed by me and considered in my medical decision making (see chart for details).  Clinical Course    Warm soaks, keep covered and dry otherwise, keflex, hydrocodone.  Dressing applied.  Prn f/u for persistent or worsened sx.  Final Clinical Impressions(s) / ED Diagnoses   Final diagnoses:  Paronychia of finger, left    New Prescriptions Discharge Medication List as of 10/11/2016  2:05 PM    START taking these medications   Details  cephALEXin (KEFLEX) 500 MG capsule Take 1 capsule (500 mg total) by mouth 4 (four) times daily., Starting Wed 10/11/2016, Print    HYDROcodone-acetaminophen (NORCO/VICODIN) 5-325 MG tablet Take 1 tablet by mouth every 4 (four) hours as needed for moderate pain., Starting Wed 10/11/2016, Print         Burgess AmorJulie Kolby Schara, PA-C 10/13/16 1508    Marily MemosJason Mesner, MD 10/18/16 [phone]

## 2018-02-14 ENCOUNTER — Other Ambulatory Visit: Payer: Self-pay

## 2018-02-14 ENCOUNTER — Emergency Department (HOSPITAL_COMMUNITY)
Admission: EM | Admit: 2018-02-14 | Discharge: 2018-02-14 | Disposition: A | Discharge status: Home / Self Care | Payer: Medicaid Other | Attending: Emergency Medicine | Admitting: Emergency Medicine

## 2018-02-14 ENCOUNTER — Encounter (HOSPITAL_COMMUNITY): Payer: Self-pay | Admitting: Emergency Medicine

## 2018-02-14 DIAGNOSIS — F17210 Nicotine dependence, cigarettes, uncomplicated: Secondary | ICD-10-CM | POA: Diagnosis not present

## 2018-02-14 DIAGNOSIS — R197 Diarrhea, unspecified: Secondary | ICD-10-CM | POA: Insufficient documentation

## 2018-02-14 DIAGNOSIS — R112 Nausea with vomiting, unspecified: Secondary | ICD-10-CM | POA: Diagnosis present

## 2018-02-14 LAB — COMPREHENSIVE METABOLIC PANEL
ALBUMIN: 4 g/dL (ref 3.5–5.0)
ALT: 14 U/L (ref 14–54)
AST: 17 U/L (ref 15–41)
Alkaline Phosphatase: 104 U/L (ref 38–126)
Anion gap: 13 (ref 5–15)
BILIRUBIN TOTAL: 0.5 mg/dL (ref 0.3–1.2)
BUN: 7 mg/dL (ref 6–20)
CHLORIDE: 101 mmol/L (ref 101–111)
CO2: 23 mmol/L (ref 22–32)
Calcium: 9.1 mg/dL (ref 8.9–10.3)
Creatinine, Ser: 0.51 mg/dL (ref 0.44–1.00)
GFR calc Af Amer: >60 mL/min (ref >60)
GFR calc non Af Amer: >60 mL/min (ref >60)
GLUCOSE: 96 mg/dL (ref 65–99)
POTASSIUM: 3.5 mmol/L (ref 3.5–5.1)
SODIUM: 137 mmol/L (ref 135–145)
TOTAL PROTEIN: 7.9 g/dL (ref 6.5–8.1)

## 2018-02-14 LAB — URINALYSIS, ROUTINE W REFLEX MICROSCOPIC
BACTERIA UA: NONE SEEN
BILIRUBIN URINE: NEGATIVE
Glucose, UA: NEGATIVE mg/dL
Ketones, ur: 20 mg/dL — AB
NITRITE: NEGATIVE
PROTEIN: NEGATIVE mg/dL
Specific Gravity, Urine: 1.026 (ref 1.005–1.030)
pH: 5 (ref 5.0–8.0)

## 2018-02-14 LAB — CBC
HEMATOCRIT: 43.6 % (ref 36.0–46.0)
HEMOGLOBIN: 14.3 g/dL (ref 12.0–15.0)
MCH: 29.2 pg (ref 26.0–34.0)
MCHC: 32.8 g/dL (ref 30.0–36.0)
MCV: 89.2 fL (ref 78.0–100.0)
Platelets: 384 10*3/uL (ref 150–400)
RBC: 4.89 MIL/uL (ref 3.87–5.11)
RDW: 13.2 % (ref 11.5–15.5)
WBC: 8.8 10*3/uL (ref 4.0–10.5)

## 2018-02-14 LAB — HCG, QUANTITATIVE, PREGNANCY

## 2018-02-14 LAB — LIPASE, BLOOD: LIPASE: 24 U/L (ref 11–51)

## 2018-02-14 MED ORDER — ONDANSETRON HCL 4 MG/2ML IJ SOLN
4.0000 mg | Freq: Once | INTRAMUSCULAR | Status: AC
  Administered 2018-02-14: 4 mg via INTRAVENOUS
  Filled 2018-02-14: qty 2

## 2018-02-14 MED ORDER — ONDANSETRON 4 MG PO TBDP: 4.0000 mg | ORAL_TABLET | Freq: Three times a day (TID) | ORAL | 0 refills | Status: AC | PRN

## 2018-02-14 MED ORDER — SODIUM CHLORIDE 0.9 % IV BOLUS
1000.0000 mL | Freq: Once | INTRAVENOUS | Status: AC
  Administered 2018-02-14: 1000 mL via INTRAVENOUS

## 2018-02-14 MED ORDER — ACETAMINOPHEN 500 MG PO TABS
500.0000 mg | ORAL_TABLET | Freq: Once | ORAL | Status: AC
  Administered 2018-02-14: 500 mg via ORAL

## 2018-02-14 MED ORDER — ACETAMINOPHEN 500 MG PO TABS
ORAL_TABLET | ORAL | Status: AC
  Filled 2018-02-14: qty 1

## 2018-02-14 NOTE — ED Provider Notes (Signed)
[facility] EMERGENCY DEPARTMENT Provider Note   CSN: [id number] Arrival date & time: 02/14/18  1542     History   Chief Complaint Chief Complaint  Patient presents with  . Emesis    HPI Jill Kramer is a 32 y.o. female.  HPI Patient presents with nausea vomiting diarrhea.  Began yesterday.  Has some mild achiness.  No clear fever.  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  No blood in the emesis or diarrhea.  States she feels bad all over.  No headache.  States she has just been vomiting up stomach contents are nothing recently. History reviewed. No pertinent past medical history.  There are no active problems to display for this patient.   Past Surgical History:  Procedure Laterality Date  . CESAREAN SECTION    . TONSILLECTOMY    . TUBAL LIGATION       OB History    Gravida  2   Para  2   Term  2   Preterm      AB      Living        SAB      TAB      Ectopic      Multiple      Live Births               Home Medications    Prior to Admission medications   Medication Sig Start Date End Date Taking? Authorizing Provider  acetaminophen (TYLENOL) 500 MG tablet Take 500 mg by mouth every 6 (six) hours as needed for mild pain or moderate pain.    Yes [provider]  loperamide (IMODIUM) 2 MG capsule Take 2 mg by mouth as needed for diarrhea or loose stools.   Yes [provider]  ondansetron (ZOFRAN-ODT) 4 MG disintegrating tablet Take 1 tablet (4 mg total) by mouth every 8 (eight) hours as needed for nausea or vomiting. 02/14/18   Benjiman Core, MD    Family History Family History  Problem Relation Age of Onset  . Hypertension Other   . Diabetes Other     Social History Social History   Tobacco Use  . Smoking status: Current Every Day Smoker    Packs/day: 1.00    Types: Cigarettes  . Smokeless tobacco: Never Used  Substance Use Topics  . Alcohol use: Yes    Comment: Occ  . Drug use:  No    Types: Marijuana    Comment: deneis today .11/29/15     Allergies   Patient has no known allergies.   Review of Systems Review of Systems  Constitutional: Positive for appetite change. Negative for fever.  HENT: Negative for dental problem.   Respiratory: Negative for shortness of breath.   Cardiovascular: Negative for chest pain.  Gastrointestinal: Positive for diarrhea, nausea and vomiting.  Genitourinary: Negative for flank pain.  Musculoskeletal: Negative for back pain.  Skin: Negative for rash.  Neurological: Negative for weakness.  Hematological: Negative for adenopathy.  Psychiatric/Behavioral: Negative for confusion.     Physical Exam Updated Vital Signs BP 134/66 (BP Location: Right Arm)   Pulse 67   Temp 99.1 F (37.3 C) (Oral)   Resp 16   Ht 5\' 4"  (1.626 m)   Wt 114.8 kg (253 lb)   LMP 01/31/2018   SpO2 98%   BMI 43.43 kg/m   Physical Exam  Constitutional: She appears well-developed.  HENT:  Head: Atraumatic.  Eyes: Pupils are equal, round,  and reactive to light.  Neck: Neck supple.  Cardiovascular: Normal rate.  Pulmonary/Chest: Effort normal.  Abdominal: She exhibits no distension.  Neurological: She is alert.  Skin: Skin is warm. Capillary refill takes less than 2 seconds.  Psychiatric: She has a normal mood and affect.     ED Treatments / Results  Labs (all labs ordered are listed, but only abnormal results are displayed) Labs Reviewed  URINALYSIS, ROUTINE W REFLEX MICROSCOPIC - Abnormal; Notable for the following components:      Result Value   APPearance HAZY (*)    Hgb urine dipstick MODERATE (*)    Ketones, ur 20 (*)    Leukocytes, UA MODERATE (*)    Squamous Epithelial / LPF 0-5 (*)    All other components within normal limits  LIPASE, BLOOD  COMPREHENSIVE METABOLIC PANEL  CBC  HCG, QUANTITATIVE, PREGNANCY    EKG None  Radiology No results found.  Procedures Procedures (including critical care  time)  Medications Ordered in ED Medications  ondansetron (ZOFRAN) injection 4 mg (4 mg Intravenous Given 02/14/18 1854)  sodium chloride 0.9 % bolus 1,000 mL (0 mLs Intravenous Stopped 02/14/18 1954)  acetaminophen (TYLENOL) tablet 500 mg (500 mg Oral Given 02/14/18 2019)     Initial Impression / Assessment and Plan / ED Course  I have reviewed the triage vital signs and the nursing notes.  Pertinent labs & imaging results that were available during my care of the patient were reviewed by me and considered in my medical decision making (see chart for details).     Patient with nausea vomiting diarrhea.  Labs reassuring.  Feels better after IV fluids and Zofran.  Tolerate orals.  Will discharge home.  Final Clinical Impressions(s) / ED Diagnoses   Final diagnoses:  Nausea vomiting and diarrhea    ED Discharge Orders        Ordered    ondansetron (ZOFRAN-ODT) 4 MG disintegrating tablet  Every 8 hours PRN     02/14/18 2025       Benjiman CorePickering, Derika Eckles, MD 02/14/18 2209

## 2018-02-14 NOTE — ED Notes (Signed)
Pt requested more crackers and drink

## 2018-02-14 NOTE — ED Triage Notes (Signed)
Patient reports diarrhea and emesis that started yesterday evening. 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

## 2018-02-14 NOTE — ED Notes (Signed)
Pt denies feeling nauseated at this time.

## 2018-02-14 NOTE — ED Notes (Addendum)
Pt given crackers and sprite.  Understood instruction of notifying nurse if pt gets nauseated

## 2018-12-24 ENCOUNTER — Emergency Department (HOSPITAL_COMMUNITY)
Admission: EM | Admit: 2018-12-24 | Discharge: 2018-12-24 | Disposition: A | Discharge status: Home / Self Care | Payer: Medicaid Other | Attending: Emergency Medicine | Admitting: Emergency Medicine

## 2018-12-24 ENCOUNTER — Other Ambulatory Visit: Payer: Self-pay

## 2018-12-24 ENCOUNTER — Encounter (HOSPITAL_COMMUNITY): Payer: Self-pay

## 2018-12-24 DIAGNOSIS — H4941 Progressive external ophthalmoplegia, right eye: Secondary | ICD-10-CM

## 2018-12-24 DIAGNOSIS — H579 Unspecified disorder of eye and adnexa: Secondary | ICD-10-CM | POA: Diagnosis present

## 2018-12-24 DIAGNOSIS — H5121 Internuclear ophthalmoplegia, right eye: Secondary | ICD-10-CM | POA: Insufficient documentation

## 2018-12-24 DIAGNOSIS — H5789 Other specified disorders of eye and adnexa: Secondary | ICD-10-CM

## 2018-12-24 DIAGNOSIS — F17210 Nicotine dependence, cigarettes, uncomplicated: Secondary | ICD-10-CM | POA: Diagnosis not present

## 2018-12-24 MED ORDER — FLUORESCEIN SODIUM 1 MG OP STRP
2.0000 | ORAL_STRIP | Freq: Once | OPHTHALMIC | Status: AC
  Administered 2018-12-24: 2 via OPHTHALMIC
  Filled 2018-12-24: qty 2

## 2018-12-24 MED ORDER — TETRACAINE HCL 0.5 % OP SOLN
2.0000 [drp] | Freq: Once | OPHTHALMIC | Status: AC
  Administered 2018-12-24: 2 [drp] via OPHTHALMIC
  Filled 2018-12-24: qty 4

## 2018-12-24 MED ORDER — ACETAMINOPHEN 325 MG PO TABS
650.0000 mg | ORAL_TABLET | Freq: Once | ORAL | Status: AC
  Administered 2018-12-24: 650 mg via ORAL
  Filled 2018-12-24: qty 2

## 2018-12-24 NOTE — Discharge Instructions (Signed)
Evaluated today for red eye.  I have talked with [facility].  Their contact information is listed on your discharge paperwork.  Please head straight over there from the emergency department.  They are expecting you.

## 2018-12-24 NOTE — ED Triage Notes (Signed)
Pt reports right eye for 3 days, now painful, blurry, with drainage.

## 2018-12-24 NOTE — ED Provider Notes (Signed)
[facility] EMERGENCY DEPARTMENT Provider Note   CSN: [id number] Arrival date & time: 12/24/18  1038  History   Chief Complaint Chief Complaint  Patient presents with  . Eye Problem   HPI Jill Kramer is a 33 y.o. female with past medical history who presents for evaluation of painful red eye.  Patient's had a painful red eye x3 days.  Patient states she woke up this morning and her vision is blurry and she is having copious clear drainage.  States she also has pain surrounding the eye and a headache which she rates a 6/10.  Does not radiate.  No known history of trauma.  Does not wear contact lenses.  Patient states she is not followed by ophthalmology and she has never had any eye problems previously.  Not take anything for symptoms. Denies fever, chills, nausea, vomiting, facial pain, facial lesions, neck pain, neck stiffness, pain, shortness of breath, abdominal pain.  Has not taken anything for pain.  Denies additional aggravating or alleviating factors.  History provided by patient.  No interpreter was used.     HPI  History reviewed. No pertinent past medical history.  There are no active problems to display for this patient.   Past Surgical History:  Procedure Laterality Date  . CESAREAN SECTION    . TONSILLECTOMY    . TUBAL LIGATION       OB History    Gravida  2   Para  2   Term  2   Preterm      AB      Living        SAB      TAB      Ectopic      Multiple      Live Births               Home Medications    Prior to Admission medications   Medication Sig Start Date End Date Taking? Authorizing Provider  acetaminophen (TYLENOL) 500 MG tablet Take 500 mg by mouth every 6 (six) hours as needed for mild pain or moderate pain.     [provider]  loperamide (IMODIUM) 2 MG capsule Take 2 mg by mouth as needed for diarrhea or loose stools.    [provider]  ondansetron (ZOFRAN-ODT) 4 MG disintegrating tablet Take 1  tablet (4 mg total) by mouth every 8 (eight) hours as needed for nausea or vomiting. 02/14/18   Benjiman Core, MD    Family History Family History  Problem Relation Age of Onset  . Hypertension Other   . Diabetes Other     Social History Social History   Tobacco Use  . Smoking status: Current Every Day Smoker    Packs/day: 1.00    Types: Cigarettes  . Smokeless tobacco: Never Used  Substance Use Topics  . Alcohol use: Not Currently    Comment: Occ  . Drug use: Not Currently    Types: Marijuana    Comment: deneis today .11/29/15     Allergies   Patient has no known allergies.   Review of Systems Review of Systems  Constitutional: Negative.   Eyes: Positive for photophobia, pain, discharge and redness. Negative for itching.  All other systems reviewed and are negative.  Physical Exam Updated Vital Signs BP 128/88 (BP Location: Right Arm)   Pulse 81   Temp 98.4 F (36.9 C) (Oral)   Resp 12   Ht 5\' 3"  (1.6 m)   Wt 113.4 kg  LMP 12/12/2018   SpO2 98%   BMI 44.29 kg/m   Physical Exam Vitals signs and nursing note reviewed.  Constitutional:      General: She is not in acute distress.    Appearance: She is well-developed. She is not ill-appearing, toxic-appearing or diaphoretic.  HENT:     Head: Atraumatic.     Nose: Nose normal.     Mouth/Throat:     Mouth: Mucous membranes are moist.     Pharynx: Oropharynx is clear.  Eyes:     General: Lids are normal. Lids are everted, no foreign bodies appreciated. No allergic shiner, visual field deficit or scleral icterus.       Right eye: Discharge present. No foreign body or hordeolum.        Left eye: No foreign body, discharge or hordeolum.     Intraocular pressure: Right eye pressure is 35 mmHg. Left eye pressure is 16 mmHg. Measurements were taken using a handheld tonometer.    Extraocular Movements: Extraocular movements intact.     Right eye: Normal extraocular motion and no nystagmus.     Left eye:  Normal extraocular motion and no nystagmus.     Conjunctiva/sclera:     Right eye: Right conjunctiva is injected. No chemosis, exudate or hemorrhage.    Left eye: Left conjunctiva is not injected. No chemosis, exudate or hemorrhage.    Pupils: Pupils are equal, round, and reactive to light.     Right eye: Pupil is not sluggish. No corneal abrasion or fluorescein uptake. Seidel exam negative.     Left eye: Pupil is not sluggish. No corneal abrasion or fluorescein uptake. Seidel exam negative.    Funduscopic exam:    Right eye: No hemorrhage, exudate, AV nicking, arteriolar narrowing or papilledema. Red reflex and venous pulsations present.        Left eye: No hemorrhage, exudate, AV nicking, arteriolar narrowing or papilledema. Red reflex and venous pulsations present.    Slit lamp exam:    Right eye: Anterior chamber flares and photophobia present. No corneal ulcer, foreign body, hyphema, hypopyon or anterior chamber bulge.     Left eye: Anterior chamber quiet.     Visual Fields: Right eye visual fields normal and left eye visual fields normal.     Comments: Erythematous right sclera. No obvious hemorrhage or hyphema.  Photophobia.  Diffuse injection.  Clear cornea. Pupil non dilated. No corneal ulcerations or lacerations. No horizontal, vertical or rotational nystagmus. Normal EOM without pain.  Neck:     Musculoskeletal: Normal range of motion.     Comments: Full active and passive ROM without pain No midline or paraspinal tenderness No nuchal rigidity or meningeal signs  Cardiovascular:     Rate and Rhythm: Normal rate.  Pulmonary:     Effort: No respiratory distress.  Abdominal:     General: There is no distension.  Musculoskeletal: Normal range of motion.  Skin:    General: Skin is warm and dry.  Neurological:     Mental Status: She is alert.     Comments: Mental Status:  Alert, oriented, thought content appropriate. Speech fluent without evidence of aphasia. Able to follow 2  step commands without difficulty.  Cranial Nerves:  II:  Peripheral visual fields grossly normal, pupils equal, round, reactive to light III,IV, VI: ptosis not present, extra-ocular motions intact bilaterally  V,VII: smile symmetric, facial light touch sensation equal VIII: hearing grossly normal bilaterally  IX,X: midline uvula rise  XI: bilateral shoulder shrug equal  and strong XII: midline tongue extension  Motor:  5/5 in upper and lower extremities bilaterally including strong and equal grip strength and dorsiflexion/plantar flexion Sensory: Pinprick and light touch normal in all extremities.  Deep Tendon Reflexes: 2+ and symmetric  Cerebellar: normal finger-to-nose with bilateral upper extremities Gait: normal gait and balance CV: distal pulses palpable throughout      ED Treatments / Results  Labs (all labs ordered are listed, but only abnormal results are displayed) Labs Reviewed - No data to display  EKG None  Radiology No results found.  Procedures Procedures (including critical care time)  Medications Ordered in ED Medications  tetracaine (PONTOCAINE) 0.5 % ophthalmic solution 2 drop (2 drops Both Eyes Given by Other 12/24/18 1329)  fluorescein ophthalmic strip 2 strip (2 strips Both Eyes Given by Other 12/24/18 1329)  acetaminophen (TYLENOL) tablet 650 mg (650 mg Oral Given 12/24/18 1329)   Initial Impression / Assessment and Plan / ED Course  I have reviewed the triage vital signs and the nursing notes.  Pertinent labs & imaging results that were available during my care of the patient were reviewed by me and considered in my medical decision making (see chart for details).  33 year old female presents for evaluation of red painful eye.  Afebrile, nonseptic, non-ill-appearing.  Began 3 days ago, however severe pain today.  Also with headache.  Normal neurologic exam without neurologic deficits.  Patient with erythematous right sclera.  No obvious hemorrhage or  hyphema.  She does have positive photophobia with diffuse injections.  Clear cornea.  Pupil nondilated.  No evidence of corneal ulcerations or lacerations on floroscein exam.  Patient with small anterior chamber flares on slit-lamp on right eye.  Normal external eye.  Normal left eye.  IOP right eye 35, repeat 36, left eye 162.  Concern for anterior uveitis vs acute angle glaucoma given elevated IOP.  Will consult with ophthalmology and reevaluate.  No evidence of HSV or VSV infection. Pt is not a contact lens wearer.  Exam non-concerning for orbital cellulitis, hyphema, corneal ulcers, corneal abrasions or trauma. Vision 20/70 right eye, 20/15 left eye.  Cons[facility]ed with Dr[facility] Robin SearingMarchase, [facility]  Will see patient in office ASAP.  Discussed with patient to follow-up in office with Dr. Robin SearingMarchase as soon as she is discharged.  Patient understands to go straight to the office, will be driven by patient's mother. Low suspicion for acute emergent pathology at this time would require further work-up in emergency department or inpatient management.  Hemodynamically stable and appropriate for DC home at this time.  Discussed return precautions.  Patient voiced understanding and is agreeable to follow-up.  Patient has been seen evaluated by attending, Dr. Adriana Simasook who agrees above treatment, plan and disposition.     Final Clinical Impressions(s) / ED Diagnoses   Final diagnoses:  Red eye  Painful ophthalmoplegia of right eye    ED Discharge Orders    None       Kamuela Magos A, PA-C 12/24/18 1715    Donnetta Hutchingook, Brian, MD 12/25/18 [id number]

## 2019-07-24 ENCOUNTER — Emergency Department (HOSPITAL_COMMUNITY)
Admission: EM | Admit: 2019-07-24 | Discharge: 2019-07-24 | Disposition: A | Discharge status: Home / Self Care | Payer: Medicaid Other | Attending: Emergency Medicine | Admitting: Emergency Medicine

## 2019-07-24 ENCOUNTER — Encounter (HOSPITAL_COMMUNITY): Payer: Self-pay | Admitting: Emergency Medicine

## 2019-07-24 DIAGNOSIS — F17210 Nicotine dependence, cigarettes, uncomplicated: Secondary | ICD-10-CM | POA: Diagnosis not present

## 2019-07-24 DIAGNOSIS — U071 COVID-19: Secondary | ICD-10-CM | POA: Insufficient documentation

## 2019-07-24 DIAGNOSIS — Z20828 Contact with and (suspected) exposure to other viral communicable diseases: Secondary | ICD-10-CM | POA: Diagnosis present

## 2019-07-24 DIAGNOSIS — Z20822 Contact with and (suspected) exposure to covid-19: Secondary | ICD-10-CM

## 2019-07-24 NOTE — Discharge Instructions (Signed)
You have been tested for COVID-19.  The test will result in the next 1 to 2 days.  If positive, you will be contacted.  Follow instructions below.

## 2019-07-24 NOTE — ED Provider Notes (Signed)
[facility] EMERGENCY [facility]PARTMENT Provider Note   CSN: [id number] Arrival date & time: 07/24/19  1000     History   Chief Complaint No chief complaint on file.   HPI Jill Kramer is a 33 y.o. female.     The history is provided by the patient. No language interpreter was used.     33 year old female with history of tobacco use presenting for exposure of COVID-19.  Patient states her aunt who lives with her was recently having COVID-19 symptoms for the past 3 to 4 days and was tested positive yesterday and now admitted to [facility].  Patient also has 2 kids and states everyone at home so far does not have any abnormal symptoms.  She does not complain of any fever chills body aches loss of taste loss of smell runny nose sneezing coughing shortness of breath nausea vomiting or diarrhea.  She has had her children tested and would like to be tested to be safe.  She has sterilized her house to the best of her ability.  History reviewed. No pertinent past medical history.  There are no active problems to display for this patient.   Past Surgical History:  Procedure Laterality Date  . CESAREAN SECTION    . TONSILLECTOMY    . TUBAL LIGATION       OB History    Gravida  2   Para  2   Term  2   Preterm      AB      Living        SAB      TAB      Ectopic      Multiple      Live Births               Home Medications    Prior to Admission medications   Medication Sig Start Date End Date Taking? Authorizing Provider  acetaminophen (TYLENOL) 500 MG tablet Take 500 mg by mouth every 6 (six) hours as needed for mild pain or moderate pain.     [provider]  loperamide (IMODIUM) 2 MG capsule Take 2 mg by mouth as needed for diarrhea or loose stools.    [provider]  ondansetron (ZOFRAN-ODT) 4 MG disintegrating tablet Take 1 tablet (4 mg total) by mouth every 8 (eight) hours as needed for nausea or vomiting.  02/14/18   Benjiman CorePickering, Nathan, MD    Family History Family History  Problem Relation Age of Onset  . Hypertension Other   . Diabetes Other     Social History Social History   Tobacco Use  . Smoking status: Current Every Day Smoker    Packs/day: 1.00    Types: Cigarettes  . Smokeless tobacco: Never Used  Substance Use Topics  . Alcohol use: Not Currently    Comment: Occ  . Drug use: Not Currently    Types: Marijuana    Comment: deneis today .11/29/15     Allergies   Patient has no known allergies.   Review of Systems Review of Systems  All other systems reviewed and are negative.    Physical Exam Updated Vital Signs BP (!) 134/93 (BP Location: Right Wrist)   Pulse 95   Temp 99.6 F (37.6 C) (Oral)   Resp 16   SpO2 97%   Physical Exam Vitals signs and nursing note reviewed.  Constitutional:      General: She is not in acute distress.  Appearance: She is well-developed.  HENT:     Head: Atraumatic.  Eyes:     Conjunctiva/sclera: Conjunctivae normal.  Neck:     Musculoskeletal: Neck supple.  Cardiovascular:     Rate and Rhythm: Normal rate and regular rhythm.     Pulses: Normal pulses.     Heart sounds: Normal heart sounds.  Pulmonary:     Effort: Pulmonary effort is normal.     Breath sounds: Normal breath sounds. No wheezing, rhonchi or rales.  Abdominal:     Palpations: Abdomen is soft.     Tenderness: There is no abdominal tenderness.  Skin:    Findings: No rash.  Neurological:     Mental Status: She is alert.  Psychiatric:        Mood and Affect: Mood normal.      ED Treatments / Results  Labs (all labs ordered are listed, but only abnormal results are displayed) Labs Reviewed  NOVEL CORONAVIRUS, NAA (HOSP ORDER, SEND-OUT TO REF LAB; TAT 18-24 HRS)    EKG None  Radiology No results found.  Procedures Procedures (including critical care time)  Medications Ordered in ED Medications - No data to display   Initial Impression  / Assessment and Plan / ED Course  I have reviewed the triage vital signs and the nursing notes.  Pertinent labs & imaging results that were available during my care of the patient were reviewed by me and considered in my medical decision making (see chart for details).       BP (!) 134/93 (BP Location: Right Wrist)   Pulse 95   Temp 99.6 F (37.6 C) (Oral)   Resp 16   SpO2 97%    Final Clinical Impressions(s) / ED Diagnoses   Final diagnoses:  Close Exposure to Covid-19 Virus    ED Discharge Orders    None     KAJA JACKOWSKI was evaluated in Emergency Department on 07/24/2019 for the symptoms described in the history of present illness. She was evaluated in the context of the global COVID-19 pandemic, which necessitated consideration that the patient might be at risk for infection with the SARS-CoV-2 virus that causes COVID-19. Institutional protocols and algorithms that pertain to the evaluation of patients at risk for COVID-19 are in a state of rapid change based on information released by regulatory bodies including the CDC and federal and state organizations. These policies and algorithms were followed during the patient's care in the ED.    Domenic Moras, PA-C 07/24/19 1038    Isla Pence, MD 07/24/19 1059

## 2019-07-24 NOTE — ED Triage Notes (Signed)
Pt  Here from home after being exposed  to covid from her aunt ,a few days ago , pt is not experiencing any symptoms

## 2019-07-28 LAB — NOVEL CORONAVIRUS, NAA (HOSP ORDER, SEND-OUT TO REF LAB; TAT 18-24 HRS): SARS-CoV-2, NAA: DETECTED — AB

## 2020-07-08 ENCOUNTER — Ambulatory Visit: Payer: Medicaid Other | Attending: Internal Medicine

## 2020-07-08 DIAGNOSIS — Z23 Encounter for immunization: Secondary | ICD-10-CM

## 2020-07-08 NOTE — Progress Notes (Signed)
   Covid-19 Vaccination Clinic  Name:  Jill Kramer    MRN: [id number] DOB: [birth year]  07/08/2020  [name] was observed post Covid-19 immunization for 15 minutes without incident. She was provided with Vaccine Information Sheet and instruction to access the V-Safe system.   [name] was instructed to call 911 with any severe reactions post vaccine: Marland Kitchen Difficulty breathing  . Swelling of face and throat  . A fast heartbeat  . A bad rash all over body  . Dizziness and weakness   Immunizations Administered    Name Date Dose VIS Date Route   Pfizer COVID-19 Vaccine 07/08/2020 11:01 AM 0.3 mL 12/31/2018 Intramuscular   Manufacturer: [employer], [employer]   Lot: [id number]   NDC: [address]-7

## 2020-07-29 ENCOUNTER — Ambulatory Visit: Payer: Medicaid Other

## 2020-08-11 ENCOUNTER — Other Ambulatory Visit: Payer: Self-pay | Admitting: Sports Medicine

## 2020-08-11 DIAGNOSIS — M545 Low back pain, unspecified: Secondary | ICD-10-CM

## 2020-08-16 ENCOUNTER — Other Ambulatory Visit: Payer: Medicaid Other

## 2020-09-05 ENCOUNTER — Ambulatory Visit
Admission: RE | Admit: 2020-09-05 | Discharge: 2020-09-05 | Disposition: A | Discharge status: Home / Self Care | Payer: Medicaid Other | Source: Ambulatory Visit | Attending: Sports Medicine | Admitting: Sports Medicine

## 2020-09-05 DIAGNOSIS — M545 Low back pain, unspecified: Secondary | ICD-10-CM

## 2022-03-03 IMAGING — MR MR LUMBAR SPINE W/O CM
4 of 5 series · 25 of 48 positions shown · non-contrast
Comparison: Prior radiograph from 06/25/2006.

CLINICAL DATA: Initial evaluation for lower back pain with
radiation into the left lower extremity for 2 months.

EXAM:
MRI LUMBAR SPINE WITHOUT CONTRAST
TECHNIQUE: Multiplanar, multisequence MR imaging of the lumbar spine was
performed. No intravenous contrast was administered.

[Series 3: T2 post-contrast · sagittal · 4.0mm · 0.53mm/px · 6 of 16 slices shown]
[im 1/16]
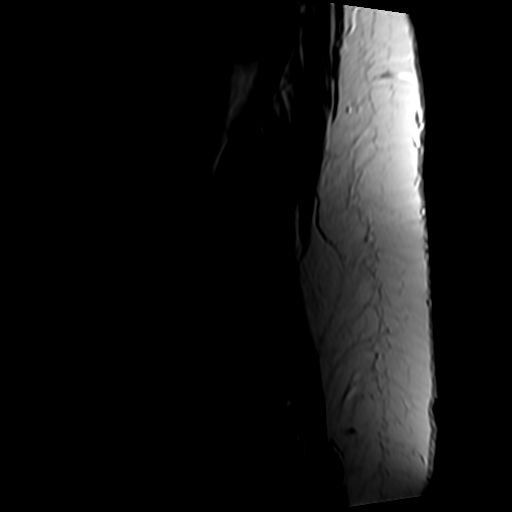
[im 4/16]
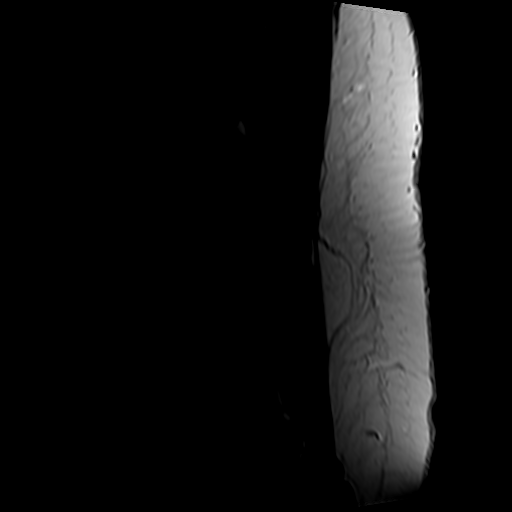
[im 7/16]
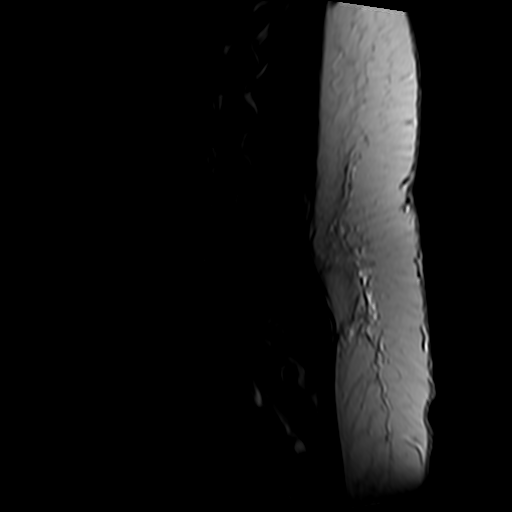
[im 10/16]
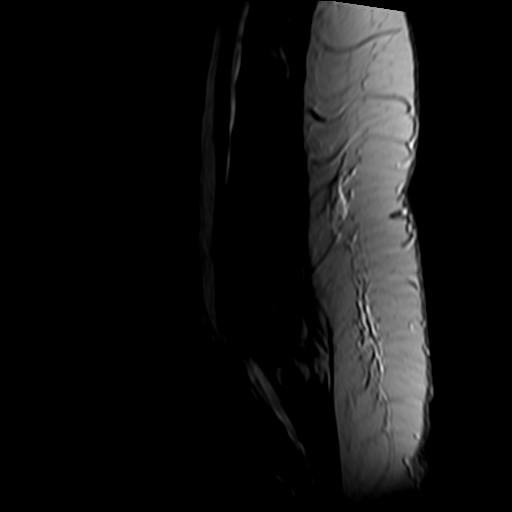
[im 13/16]
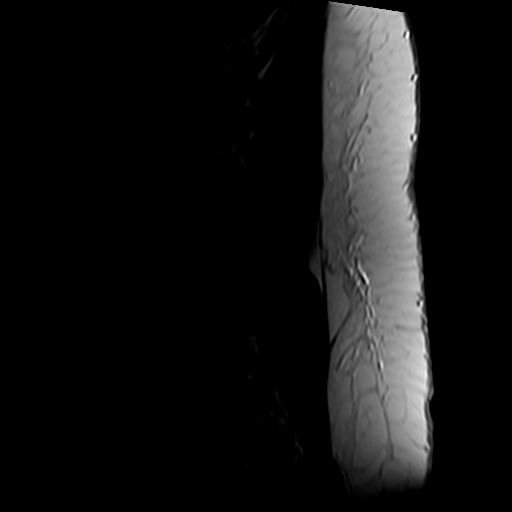
[im 16/16]
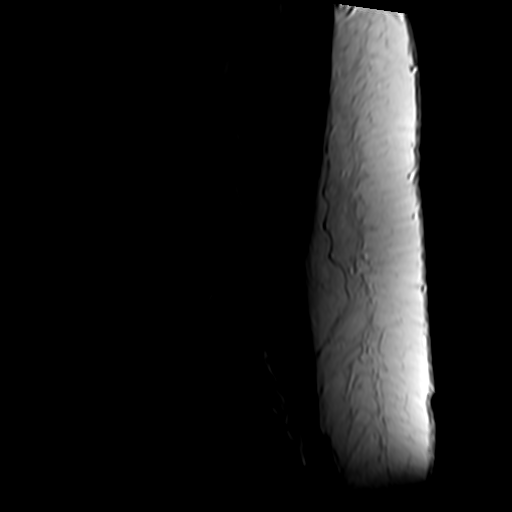

[Series 5: T1 · sagittal · 4.0mm · 0.53mm/px · 6 of 16 slices shown (1 of 2)]
[im 1/16]
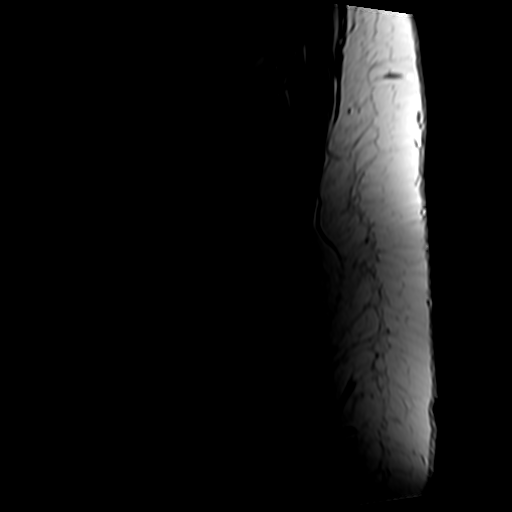
[im 4/16]
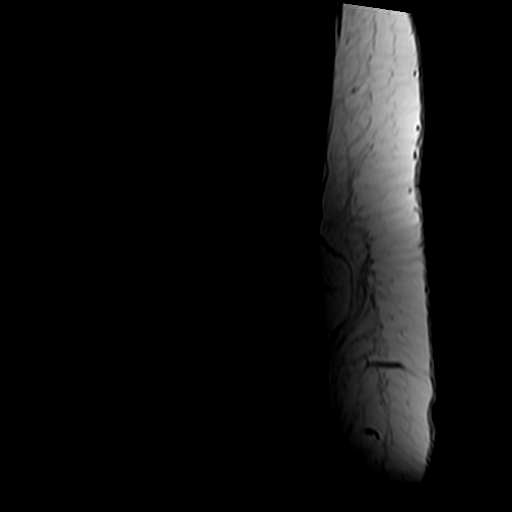
[im 7/16]
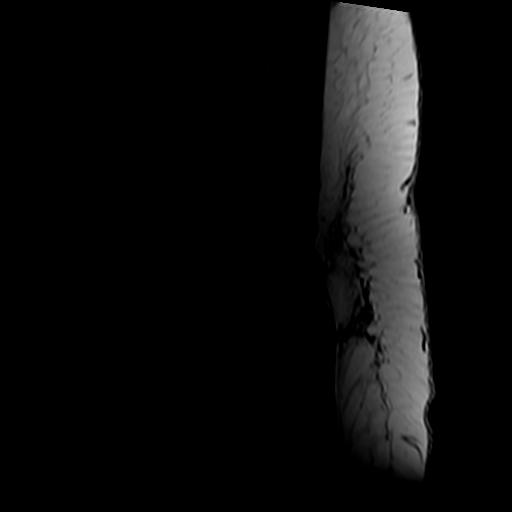
[im 10/16]
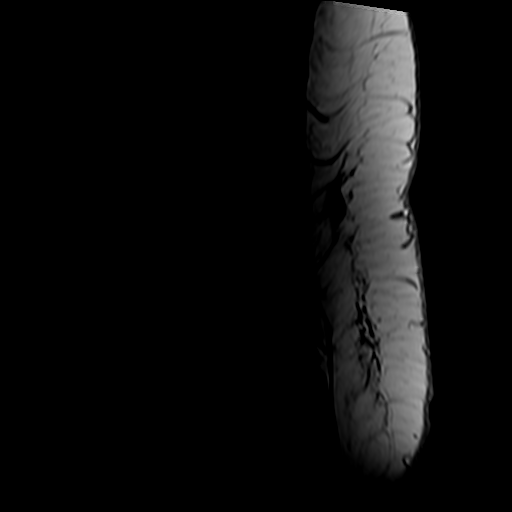
[im 13/16]
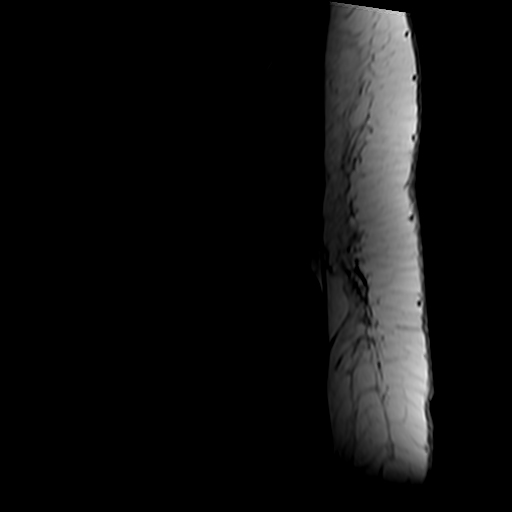
[im 16/16]
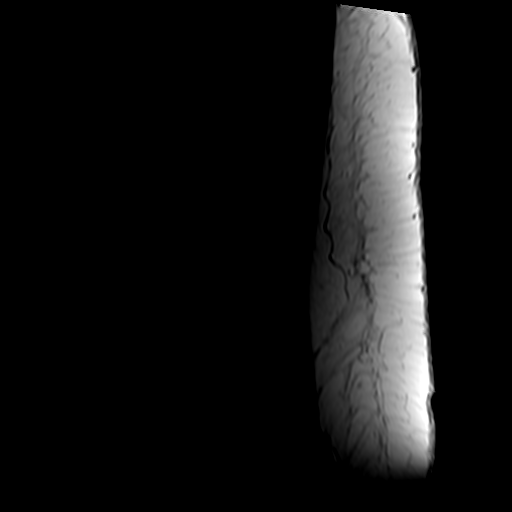

[Series 6: T2 · axial · 4.0mm · 0.70mm/px · z∈[-108,+86]mm · 9 of 38 slices shown]
[im 1/38]
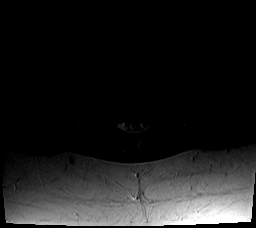
[im 6/38]
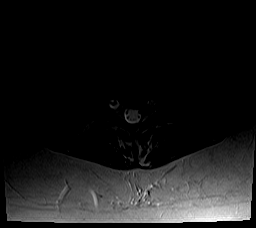
[im 11/38]
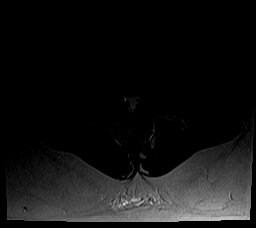
[im 16/38]
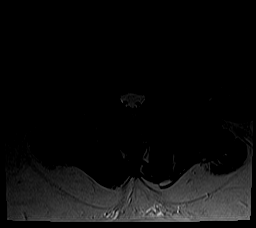
[im 19/38]
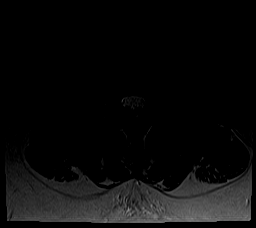
[im 22/38]
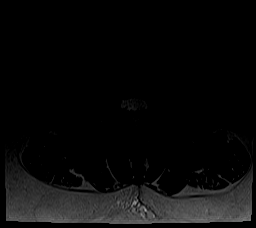
[im 27/38]
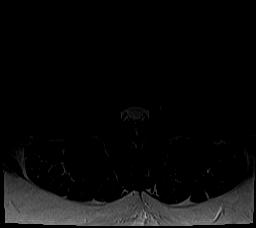
[im 32/38]
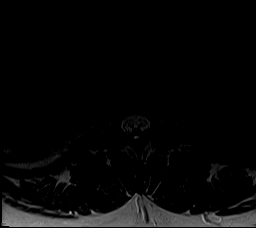
[im 38/38]
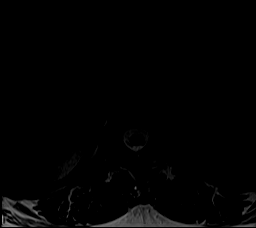

[Series 7: T1 · axial · 4.0mm · 0.35mm/px · z∈[-108,+55]mm · 4 of 38 slices shown (2 of 2)]
[im 1/38]
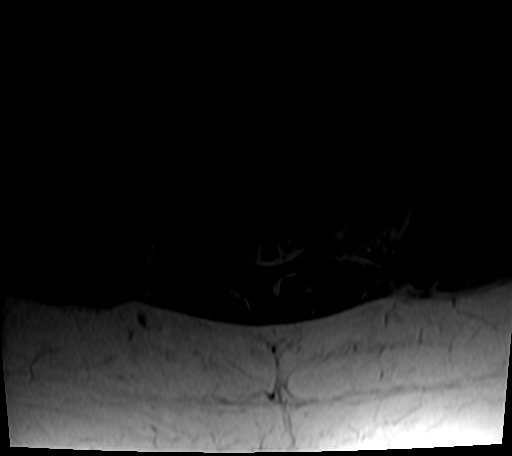
[im 6/38]
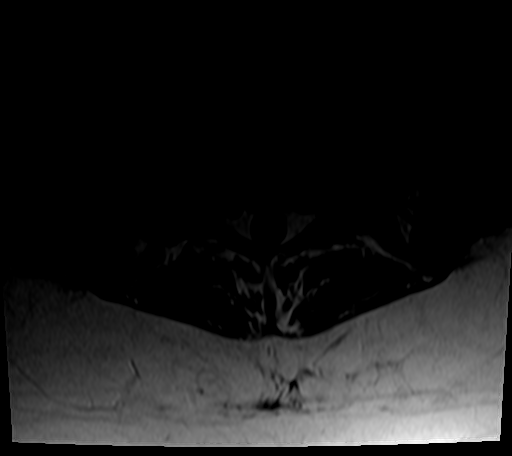
[im 19/38]
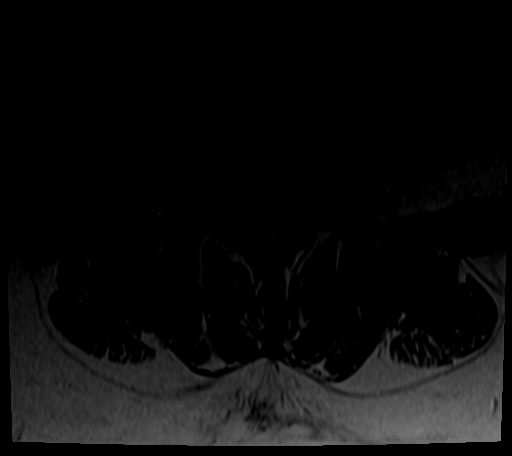
[im 32/38]
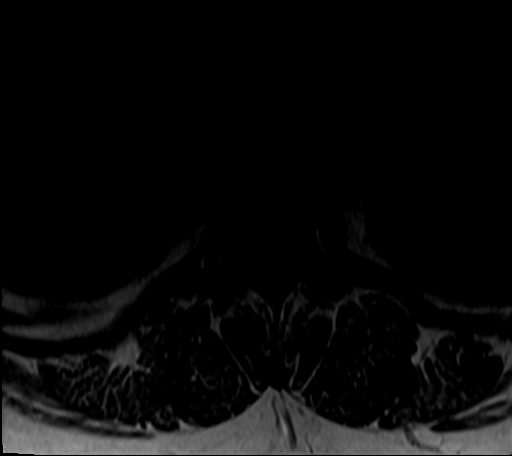

[25 of 48 positions shown; findings below may reference images not displayed]

FINDINGS: Segmentation: Standard. Lowest well-formed disc space labeled the
L5-S1 level.

Alignment: Chronic bilateral pars defects at L5 with associated 8 mm
spondylolisthesis. Associated trace 2 mm retrolisthesis of L4 on L5.

Vertebrae: Vertebral body height maintained without acute or chronic
fracture. Bone marrow signal intensity within normal limits. No
worrisome osseous lesions. Discogenic reactive endplate change
present about the L5-S1 interspace. No other abnormal marrow edema.

Conus medullaris and cauda equina: Conus extends to the L1-2 level.
Conus and cauda equina appear normal.

Paraspinal and other soft tissues: Unremarkable.

Disc levels:

Lumbar spine is image from the T11-12 level inferiorly. No
significant findings are seen through the L3-4 level.

L4-5: Trace 2 mm retrolisthesis. Disc desiccation with mild disc
bulge. Mild bilateral facet hypertrophy with associated trace joint
effusions. No significant spinal stenosis. Mild bilateral L4
foraminal narrowing. No frank impingement.

L5-S1: Chronic bilateral pars defects with associated 8 mm
spondylolisthesis. Associated degenerative intervertebral disc space
narrowing with disc desiccation and broad posterior pseudo disc
bulge/uncovering. Reactive endplate change with associated marrow
edema. Moderate facet hypertrophy. No significant spinal stenosis.
Severe bilateral L5 foraminal narrowing due to slippage, disc bulge,
and reactive endplate change. Either of the L5 nerve roots could be
affected.
IMPRESSION: 1. Chronic bilateral pars defects at L5 with associated 8 mm
spondylolisthesis and resultant severe bilateral L5 foraminal
stenosis. Either of the L5 nerve roots could be affected.
2. Mild disc bulge and facet hypertrophy at L4-5 with resultant mild
bilateral L4 foraminal stenosis.

## 2024-05-13 ENCOUNTER — Telehealth: Payer: Self-pay

## 2024-05-13 NOTE — Telephone Encounter (Signed)
 Received referral from [facility] for a sleep study. Placed in sleep mailbox

## 2024-06-03 ENCOUNTER — Encounter: Payer: Self-pay | Admitting: *Deleted

## 2024-06-04 ENCOUNTER — Institutional Professional Consult (permissible substitution): Admitting: Neurology

## 2024-10-10 ENCOUNTER — Emergency Department (HOSPITAL_COMMUNITY)
Admission: EM | Admit: 2024-10-10 | Discharge: 2024-10-10 | Disposition: A | Discharge status: Home / Self Care | Attending: Emergency Medicine | Admitting: Emergency Medicine

## 2024-10-10 ENCOUNTER — Encounter (HOSPITAL_COMMUNITY): Payer: Self-pay | Admitting: *Deleted

## 2024-10-10 ENCOUNTER — Other Ambulatory Visit: Payer: Self-pay

## 2024-10-10 DIAGNOSIS — L03012 Cellulitis of left finger: Secondary | ICD-10-CM | POA: Insufficient documentation

## 2024-10-10 MED ORDER — POVIDONE-IODINE 10 % EX SOLN
CUTANEOUS | Status: DC | PRN
  Filled 2024-10-10: qty 14.8

## 2024-10-10 MED ORDER — CEPHALEXIN 500 MG PO CAPS: 500.0000 mg | ORAL_CAPSULE | Freq: Three times a day (TID) | ORAL | 0 refills | Status: DC

## 2024-10-10 MED ORDER — LIDOCAINE HCL (PF) 1 % IJ SOLN
5.0000 mL | Freq: Once | INTRAMUSCULAR | Status: AC
  Administered 2024-10-10: 5 mL via INTRADERMAL
  Filled 2024-10-10: qty 5

## 2024-10-10 NOTE — ED Triage Notes (Signed)
 Pt with swelling and heat to left middle finger nail x 2 days. + throbbing

## 2024-10-10 NOTE — ED Provider Notes (Signed)
 [place] EMERGENCY DEPARTMENT AT [facility] Provider Note   CSN: [id number] Arrival date & time: 10/10/24  1441     Patient presents with: Hand Pain   Jill Kramer is a 38 y.o. female.    Hand Pain       Jill Kramer is a 38 y.o. female who presents to the Emergency Department complaining of pain swelling and redness of her left middle finger for 2 to 3 days.  She states she had some mild pain 1 week ago that gradually worsened but there was no swelling or redness.  She states she does not bite her nails no known injury.  No numbness of the finger    Prior to Admission medications   Medication Sig Start Date End Date Taking? Authorizing Provider  acetaminophen  (TYLENOL ) 500 MG tablet Take 500 mg by mouth every 6 (six) hours as needed for mild pain or moderate pain.     [provider]  loperamide  (IMODIUM ) 2 MG capsule Take 2 mg by mouth as needed for diarrhea or loose stools.    [provider]  ondansetron  (ZOFRAN -ODT) 4 MG disintegrating tablet Take 1 tablet (4 mg total) by mouth every 8 (eight) hours as needed for nausea or vomiting. 02/14/18   Patsey Lot, MD    Allergies: Patient has no known allergies.    Review of Systems  Constitutional:  Negative for appetite change, chills and fever.  Musculoskeletal:  Positive for arthralgias (Left middle finger pain redness and swelling).  Neurological:  Negative for weakness and numbness.    Updated Vital Signs BP 121/88 (BP Location: Right Arm)   Pulse (!) 101   Temp 98.1 F (36.7 C)   Resp 20   Ht 5' 3 (1.6 m)   Wt (!) 145.2 kg   LMP 10/01/2024   SpO2 93%   BMI 56.69 kg/m   Physical Exam Constitutional:      General: She is not in acute distress.    Appearance: She is not toxic-appearing.  Cardiovascular:     Pulses: Normal pulses.  Pulmonary:     Effort: Pulmonary effort is normal.  Musculoskeletal:        General: Swelling present. Normal range of motion.      Comments: Localized erythema swelling and fluctuance at the ulnar aspect of the distal left middle finger at the paronychium.  No drainage.  Distal tuft of the finger non tender  Neurological:     Mental Status: She is alert.     (all labs ordered are listed, but only abnormal results are displayed) Labs Reviewed - No data to display  EKG: None  Radiology: No results found.   Drain paronychia  Date/Time: 10/10/2024 5:06 PM  Performed by: Herlinda Milling, PA-C Authorized by: Herlinda Milling, PA-C  Consent: Verbal consent obtained Consent given by: patient Patient understanding: patient states understanding of the procedure being performed Patient identity confirmed: verbally with patient and provided demographic data Preparation: Patient was prepped and draped in the usual sterile fashion. Local anesthesia used: yes Anesthesia: digital block  Anesthesia: Local anesthesia used: yes Local Anesthetic: lidocaine  1% without epinephrine Anesthetic total: 2 mL  Sedation: Patient sedated: no  Patient tolerance: patient tolerated the procedure well with no immediate complications Comments: Successful drainage of paronychia to left middle finger      Medications Ordered in the ED  lidocaine  (PF) (XYLOCAINE ) 1 % injection 5 mL (has no administration in time range)  povidone-iodine  (BETADINE ) 10 % external  solution (has no administration in time range)                                    Medical Decision Making   Patient here with 2 to 3-day history of pain and swelling localized to the ulnar aspect of the distal left middle finger near the nail.  Neurovascularly intact.  No drainage full range of motion of the fingers of the left hand  Likely paronychia.  No evidence of felon    Amount and/or Complexity of Data Reviewed Discussion of management or test interpretation with external provider(s): Successful drainage of paronychia.  Patient tolerated procedure well.  Warm  water soaks and outpatient follow-up if needed.  Given return precautions as well.  Risk OTC drugs. Prescription drug management.        Final diagnoses:  Paronychia of finger of left hand    ED Discharge Orders     None          Herlinda Milling, PA-C 10/10/24 1715    Suzette Pac, MD 10/12/24 [phone]

## 2024-10-10 NOTE — ED Notes (Addendum)
 Pt/family received d/c paperwork at this time. After going over the paperwork any questions, comments, or concerns were answered to the best of this nurse's knowledge. The pt/family verbally acknowledged the teachings/instructions.  Pt's left middle finger was wrapped in gauze before pt d/c.

## 2024-10-10 NOTE — Discharge Instructions (Signed)
 Warm Epsom salt water soaks to your finger.  Follow-up with your primary care provider for recheck if needed, return to emergency department for any new or worsening symptoms

## 2024-11-14 ENCOUNTER — Encounter (HOSPITAL_COMMUNITY): Payer: Self-pay

## 2024-11-14 ENCOUNTER — Emergency Department (HOSPITAL_COMMUNITY)
Admission: EM | Admit: 2024-11-14 | Discharge: 2024-11-14 | Disposition: A | Discharge status: Home / Self Care | Attending: Emergency Medicine | Admitting: Emergency Medicine

## 2024-11-14 ENCOUNTER — Emergency Department (HOSPITAL_COMMUNITY)

## 2024-11-14 ENCOUNTER — Other Ambulatory Visit: Payer: Self-pay

## 2024-11-14 DIAGNOSIS — R001 Bradycardia, unspecified: Secondary | ICD-10-CM | POA: Diagnosis not present

## 2024-11-14 DIAGNOSIS — R55 Syncope and collapse: Secondary | ICD-10-CM | POA: Diagnosis not present

## 2024-11-14 DIAGNOSIS — R059 Cough, unspecified: Secondary | ICD-10-CM | POA: Diagnosis present

## 2024-11-14 DIAGNOSIS — R911 Solitary pulmonary nodule: Secondary | ICD-10-CM

## 2024-11-14 DIAGNOSIS — R051 Acute cough: Secondary | ICD-10-CM

## 2024-11-14 DIAGNOSIS — E041 Nontoxic single thyroid nodule: Secondary | ICD-10-CM

## 2024-11-14 DIAGNOSIS — N3001 Acute cystitis with hematuria: Secondary | ICD-10-CM

## 2024-11-14 HISTORY — DX: Unspecified asthma, uncomplicated: J45.909

## 2024-11-14 LAB — URINALYSIS, ROUTINE W REFLEX MICROSCOPIC
Bacteria, UA: NONE SEEN
Bilirubin Urine: NEGATIVE
Glucose, UA: NEGATIVE mg/dL
Ketones, ur: NEGATIVE mg/dL
Nitrite: NEGATIVE
Protein, ur: 30 mg/dL — AB
Specific Gravity, Urine: 1.021 (ref 1.005–1.030)
pH: 5 (ref 5.0–8.0)

## 2024-11-14 LAB — URINE DRUG SCREEN
Amphetamines: NEGATIVE
Barbiturates: NEGATIVE
Benzodiazepines: NEGATIVE
Cocaine: NEGATIVE
Fentanyl: NEGATIVE
Methadone Scn, Ur: NEGATIVE
Opiates: NEGATIVE
Tetrahydrocannabinol: POSITIVE — AB

## 2024-11-14 LAB — COMPREHENSIVE METABOLIC PANEL WITH GFR
ALT: 34 U/L (ref 0–44)
AST: 42 U/L — ABNORMAL HIGH (ref 15–41)
Albumin: 4.4 g/dL (ref 3.5–5.0)
Alkaline Phosphatase: 121 U/L (ref 38–126)
Anion gap: 14 (ref 5–15)
BUN: 7 mg/dL (ref 6–20)
CO2: 25 mmol/L (ref 22–32)
Calcium: 9.3 mg/dL (ref 8.9–10.3)
Chloride: 99 mmol/L (ref 98–111)
Creatinine, Ser: 0.74 mg/dL (ref 0.44–1.00)
GFR, Estimated: >60 mL/min
Glucose, Bld: 119 mg/dL — ABNORMAL HIGH (ref 70–99)
Potassium: 4 mmol/L (ref 3.5–5.1)
Sodium: 138 mmol/L (ref 135–145)
Total Bilirubin: 0.4 mg/dL (ref 0.0–1.2)
Total Protein: 7.9 g/dL (ref 6.5–8.1)

## 2024-11-14 LAB — CBC WITH DIFFERENTIAL/PLATELET
Abs Immature Granulocytes: 0.03 K/uL (ref 0.00–0.07)
Basophils Absolute: 0 K/uL (ref 0.0–0.1)
Basophils Relative: 0 %
Eosinophils Absolute: 0.2 K/uL (ref 0.0–0.5)
Eosinophils Relative: 2 %
HCT: 46.5 % — ABNORMAL HIGH (ref 36.0–46.0)
Hemoglobin: 15.5 g/dL — ABNORMAL HIGH (ref 12.0–15.0)
Immature Granulocytes: 0 %
Lymphocytes Relative: 25 %
Lymphs Abs: 2 K/uL (ref 0.7–4.0)
MCH: 31.6 pg (ref 26.0–34.0)
MCHC: 33.3 g/dL (ref 30.0–36.0)
MCV: 94.9 fL (ref 80.0–100.0)
Monocytes Absolute: 0.5 K/uL (ref 0.1–1.0)
Monocytes Relative: 7 %
Neutro Abs: 5.2 K/uL (ref 1.7–7.7)
Neutrophils Relative %: 66 %
Platelets: 365 K/uL (ref 150–400)
RBC: 4.9 MIL/uL (ref 3.87–5.11)
RDW: 13.9 % (ref 11.5–15.5)
WBC: 7.9 K/uL (ref 4.0–10.5)
nRBC: 0 % (ref 0.0–0.2)

## 2024-11-14 LAB — ETHANOL: Alcohol, Ethyl (B): <15 mg/dL

## 2024-11-14 LAB — MAGNESIUM: Magnesium: 2.2 mg/dL (ref 1.7–2.4)

## 2024-11-14 MED ORDER — PROMETHAZINE-DM 6.25-15 MG/5ML PO SYRP: 5.0000 mL | ORAL_SOLUTION | Freq: Four times a day (QID) | ORAL | 0 refills | Status: AC | PRN

## 2024-11-14 MED ORDER — PREDNISONE 10 MG PO TABS: 40.0000 mg | ORAL_TABLET | Freq: Every day | ORAL | 0 refills | Status: AC

## 2024-11-14 MED ORDER — IOHEXOL 350 MG/ML SOLN
100.0000 mL | Freq: Once | INTRAVENOUS | Status: AC | PRN
  Administered 2024-11-14: 100 mL via INTRAVENOUS

## 2024-11-14 MED ORDER — ACETAMINOPHEN 325 MG PO TABS
650.0000 mg | ORAL_TABLET | Freq: Once | ORAL | Status: AC
  Administered 2024-11-14: 650 mg via ORAL
  Filled 2024-11-14: qty 2

## 2024-11-14 MED ORDER — LORAZEPAM 2 MG/ML IJ SOLN
1.0000 mg | Freq: Once | INTRAMUSCULAR | Status: AC
  Administered 2024-11-14: 1 mg via INTRAVENOUS
  Filled 2024-11-14: qty 1

## 2024-11-14 MED ORDER — CEPHALEXIN 500 MG PO CAPS: 500.0000 mg | ORAL_CAPSULE | Freq: Three times a day (TID) | ORAL | 0 refills | Status: AC

## 2024-11-14 MED ORDER — IPRATROPIUM-ALBUTEROL 0.5-2.5 (3) MG/3ML IN SOLN
3.0000 mL | Freq: Once | RESPIRATORY_TRACT | Status: AC
  Administered 2024-11-14: 3 mL via RESPIRATORY_TRACT
  Filled 2024-11-14: qty 3

## 2024-11-14 MED ORDER — LACTATED RINGERS IV BOLUS
1000.0000 mL | Freq: Once | INTRAVENOUS | Status: AC
  Administered 2024-11-14: 1000 mL via INTRAVENOUS

## 2024-11-14 MED ORDER — CEPHALEXIN 500 MG PO CAPS
500.0000 mg | ORAL_CAPSULE | Freq: Once | ORAL | Status: AC
  Administered 2024-11-14: 500 mg via ORAL
  Filled 2024-11-14: qty 1

## 2024-11-14 NOTE — Plan of Care (Signed)
 On-call neurology note Called by the ED provider at [facility] guarding this patient with 2 seizures back-to-back, history of seizure disorder, back to baseline.  CT head and labs without major concerns.  Withdrawal seizure versus first-time seizure-no need for AED.  Seizure precautions need to be maintained This was discussed with the ED provider.  Needs outpatient follow-up.  Eligio Lav, MD Neurology

## 2024-11-14 NOTE — ED Notes (Signed)
 Pt/family received d/c paperwork at this time. After going over the paperwork any questions, comments, or concerns were answered to the best of this nurse's knowledge. The pt/family verbally acknowledged the teachings/instructions.

## 2024-11-14 NOTE — ED Triage Notes (Signed)
 Pt reports she has had a cough x 1 month and she coughs so hard it makes her pass out.
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
## 2024-11-14 NOTE — Discharge Instructions (Signed)
 Please follow-up with your primary care doctor regarding the thyroid nodule and the pulmonary nodule noted on CT scan today.  You were provided with a copy of your CT scan report.  Please follow-up closely with neurology on an outpatient basis.  Please call their office to ensure that you have an appointment.  Please do not drive for at least the next 6 months or until cleared by neurology to do so.  Do not operate heavy machinery and avoid any illicit substances.  Decrease your alcohol intake.  Take all antibiotics as directed.  Return to emergency department immediately for any new or worsening symptoms.

## 2024-11-14 NOTE — ED Provider Notes (Signed)
 Patient was signed out to myself at shift change.  Per report the patient had 2 bouts of posttussive syncope today with an bouts of confusion upon awakening.  She did bite her tongue and did urinate on herself.  Patient's only complaint at this time is pain across her chest from where she fell.  Patient does admit to daily alcohol use.  She notes that she last drank alcohol this morning. Physical Exam  BP (!) 138/35   Pulse 100   Temp 99.3 F (37.4 C) (Oral)   Resp 18   Wt (!) 145.2 kg   LMP 10/28/2024 (Approximate)   SpO2 94%   BMI 56.70 kg/m   Physical Exam  Procedures  Procedures  ED Course / MDM    Medical Decision Making Amount and/or Complexity of Data Reviewed Labs: ordered. Radiology: ordered.  Risk OTC drugs. Prescription drug management.   Patient does remain stable at this time.  I did discuss her case with Dr. Voncile with neurology given the concern for possible seizure-like activity.  He did recommend seizure precautions and discharge home with outpatient neurology follow-up.  He agreed with the workup that has been performed thus far.  Will treat her for urinary tract infection.  She was made aware of the thyroid nodule as well as a lung nodule and provided with a copy of her CT scan report and directed to follow-up with her PCP for thyroid ultrasound and monitoring of the pulmonary nodule.  Blood work is been overall unremarkable at this point.  Seizure precautions and driving precautions were provided to the patient.  The importance of close follow-up with neurology on an outpatient basis was discussed as well.  Strict turn precautions were provided for any new or worsening symptoms.  Patient voiced understanding and had no additional questions. Patient case was discussed with attending physician as well who is in agreement to plan at this time.        Jill Lonni BIRCH, PA-C 11/14/24 2100    Suzette Pac, MD 11/16/24 1051

## 2024-11-16 LAB — URINE CULTURE: Culture: 20000 — AB

## 2024-11-17 ENCOUNTER — Telehealth (HOSPITAL_BASED_OUTPATIENT_CLINIC_OR_DEPARTMENT_OTHER): Payer: Self-pay | Admitting: *Deleted

## 2024-11-17 ENCOUNTER — Encounter: Payer: Self-pay | Admitting: Neurology

## 2024-11-17 NOTE — Telephone Encounter (Signed)
 Post ED Visit - Positive Culture Follow-up  Culture report reviewed by antimicrobial stewardship pharmacist: [facility] [x]  Sharyne Glatter, Pharm.D. []  Venetia Gully, Pharm.D., BCPS AQ-ID []  Garrel Crews, Pharm.D., BCPS []  Almarie Lunger, Pharm.D., BCPS []  [place], [address].D., BCPS, AAHIVP []  Rosaline Bihari, Pharm.D., BCPS, AAHIVP []  Vernell Meier, PharmD, BCPS []  Latanya Hint, PharmD, BCPS []  Donald Medley, PharmD, BCPS []  Rocky Bold, PharmD []  Dorothyann Alert, PharmD, BCPS []  Morene Babe, PharmD  [facility] []  Rosaline Edison, PharmD []  Romona Bliss, PharmD []  Dolphus Roller, PharmD []  Veva Seip, Rph []  Vernell Daunt) Leonce, PharmD []  Eva Allis, PharmD []  Rosaline Millet, PharmD []  Iantha Batch, PharmD []  Arvin Gauss, PharmD []  Wanda Hasting, PharmD []  Ronal Rav, PharmD []  Rocky Slade, PharmD []  Bard Jeans, PharmD   Positive urine culture Treated with cephalexin , organism sensitive to the same and no further patient follow-up is required at this time.  Jill Kramer 11/17/2024, 10:11 AM

## 2024-11-28 ENCOUNTER — Ambulatory Visit: Payer: Self-pay | Admitting: Neurology

## 2025-01-14 ENCOUNTER — Ambulatory Visit: Admitting: Neurology
